# Patient Record
Sex: Female | Born: 1970 | Race: White | Hispanic: No | Marital: Married | Smoking: Former smoker
Health system: Southern US, Community
[De-identification: ages and names within clinical notes are randomized; demographics above are authoritative.]

## PROBLEM LIST (undated history)

## (undated) DIAGNOSIS — T7840XA Allergy, unspecified, initial encounter: Secondary | ICD-10-CM

## (undated) DIAGNOSIS — G709 Myoneural disorder, unspecified: Secondary | ICD-10-CM

## (undated) DIAGNOSIS — E119 Type 2 diabetes mellitus without complications: Secondary | ICD-10-CM

## (undated) HISTORY — DX: Allergy, unspecified, initial encounter: T78.40XA

## (undated) HISTORY — DX: Myoneural disorder, unspecified: G70.9

## (undated) HISTORY — DX: Type 2 diabetes mellitus without complications: E11.9

## (undated) HISTORY — PX: PILONIDAL CYST / SINUS EXCISION: SUR543

## (undated) HISTORY — PX: CHOLECYSTECTOMY: SHX55

---

## 2004-06-26 ENCOUNTER — Emergency Department (HOSPITAL_COMMUNITY): Admission: EM | Admit: 2004-06-26 | Discharge: 2004-06-26 | Payer: Self-pay | Admitting: Emergency Medicine

## 2010-06-29 ENCOUNTER — Emergency Department (HOSPITAL_COMMUNITY)
Admission: EM | Admit: 2010-06-29 | Discharge: 2010-06-29 | Payer: Self-pay | Source: Home / Self Care | Admitting: Emergency Medicine

## 2011-08-27 ENCOUNTER — Ambulatory Visit (INDEPENDENT_AMBULATORY_CARE_PROVIDER_SITE_OTHER): Payer: 59

## 2011-08-27 DIAGNOSIS — E559 Vitamin D deficiency, unspecified: Secondary | ICD-10-CM

## 2011-08-27 DIAGNOSIS — I10 Essential (primary) hypertension: Secondary | ICD-10-CM

## 2011-08-27 DIAGNOSIS — R609 Edema, unspecified: Secondary | ICD-10-CM

## 2011-08-27 DIAGNOSIS — E789 Disorder of lipoprotein metabolism, unspecified: Secondary | ICD-10-CM

## 2011-08-27 DIAGNOSIS — M79609 Pain in unspecified limb: Secondary | ICD-10-CM

## 2011-08-27 DIAGNOSIS — R52 Pain, unspecified: Secondary | ICD-10-CM

## 2011-11-26 ENCOUNTER — Other Ambulatory Visit: Payer: Self-pay | Admitting: Physician Assistant

## 2011-11-26 MED ORDER — GABAPENTIN 300 MG PO CAPS: 300.0000 mg | ORAL_CAPSULE | Freq: Two times a day (BID) | ORAL | Status: DC

## 2012-03-01 ENCOUNTER — Other Ambulatory Visit: Payer: Self-pay | Admitting: Physician Assistant

## 2012-03-06 ENCOUNTER — Ambulatory Visit (INDEPENDENT_AMBULATORY_CARE_PROVIDER_SITE_OTHER): Payer: 59 | Admitting: Physician Assistant

## 2012-03-06 VITALS — BP 162/92 | HR 86 | Temp 98.4°F | Resp 16 | Ht 63.0 in | Wt 262.0 lb

## 2012-03-06 DIAGNOSIS — R635 Abnormal weight gain: Secondary | ICD-10-CM

## 2012-03-06 DIAGNOSIS — R5381 Other malaise: Secondary | ICD-10-CM

## 2012-03-06 DIAGNOSIS — R5383 Other fatigue: Secondary | ICD-10-CM

## 2012-03-06 LAB — T4, FREE: Free T4: 0.92 ng/dL (ref 0.80–1.80)

## 2012-03-06 NOTE — Progress Notes (Signed)
  Subjective:    Patient ID: Jeanne Choi, female    DOB: [birth year], 41 y.o.   MRN: [id number]  HPI Ms. Dierks comes in today to discuss lab results from her GYN done in 3/13.  She had a TSH/Free T4 done at her last ov with Dr. Waynard Reeds and it was noted that her TSH was 5.497 but free T4 was normal.  Also of note her A1C was 6.1%.  She has not followed up on these lab results until now.  She reports that she has had fatigue, hair loss and weight gain.  Her weight has been difficult for her to manage in general, especially the last 5 years.  She does not exercise because of her job and various pains she has at times.  She admits to poor eating especially carbs.  She is aware of the need to change her lifestyle but gets very discouraged. She had normal TSH and T4 in December 2012 here.  She has a potential PCOS diagnosis from GYN but not confirmed yet, per pt.  She will be following up for CPE with Benny Lennert, PA-C when she returns from maternity leave in several weeks but wanted to address this now.   Review of Systems As noted in HPI, otherwise negative     Objective:   Physical Exam  Constitutional: She is oriented to person, place, and time. She appears well-developed and well-nourished.  HENT:  Mouth/Throat: Oropharynx is clear and moist.  Neck: No mass and no thyromegaly present.  Cardiovascular: Normal rate and regular rhythm.   Pulmonary/Chest: Effort normal and breath sounds normal.  Lymphadenopathy:    She has no cervical adenopathy.  Neurological: She is alert and oriented to person, place, and time.  Skin: Skin is warm.        Assessment & Plan:  Fatigue Hair loss Abnormal Labs  Check TSH, Free T4 Long discussion about weight loss, carb intake, exercise options.   Follow up after labs

## 2012-03-08 ENCOUNTER — Telehealth: Payer: Self-pay

## 2012-03-08 NOTE — Telephone Encounter (Signed)
Explained lab results to pt. She would like a referral to an endocrinologist but doesn't remember his name. She will call back in and give [place] his name.

## 2012-03-23 ENCOUNTER — Ambulatory Visit (INDEPENDENT_AMBULATORY_CARE_PROVIDER_SITE_OTHER): Payer: 59 | Admitting: Internal Medicine

## 2012-03-23 ENCOUNTER — Encounter: Payer: Self-pay | Admitting: Physician Assistant

## 2012-03-23 VITALS — BP 152/87 | HR 79 | Temp 97.7°F | Resp 20 | Ht 63.0 in | Wt 258.0 lb

## 2012-03-23 DIAGNOSIS — M533 Sacrococcygeal disorders, not elsewhere classified: Secondary | ICD-10-CM

## 2012-03-23 DIAGNOSIS — N926 Irregular menstruation, unspecified: Secondary | ICD-10-CM

## 2012-03-23 DIAGNOSIS — R030 Elevated blood-pressure reading, without diagnosis of hypertension: Secondary | ICD-10-CM

## 2012-03-23 DIAGNOSIS — Z0000 Encounter for general adult medical examination without abnormal findings: Secondary | ICD-10-CM

## 2012-03-23 DIAGNOSIS — IMO0001 Reserved for inherently not codable concepts without codable children: Secondary | ICD-10-CM

## 2012-03-23 DIAGNOSIS — E663 Overweight: Secondary | ICD-10-CM

## 2012-03-23 DIAGNOSIS — E282 Polycystic ovarian syndrome: Secondary | ICD-10-CM

## 2012-03-23 LAB — LIPID PANEL
LDL Cholesterol: 135 mg/dL — ABNORMAL HIGH (ref 0–99)
Total CHOL/HDL Ratio: 4.3 Ratio
Triglycerides: 115 mg/dL (ref <150)
VLDL: 23 mg/dL (ref 0–40)

## 2012-03-23 LAB — COMPREHENSIVE METABOLIC PANEL
ALT: 20 U/L (ref 0–35)
AST: 25 U/L (ref 0–37)
Alkaline Phosphatase: 83 U/L (ref 39–117)
Calcium: 9.1 mg/dL (ref 8.4–10.5)
Chloride: 106 mEq/L (ref 96–112)
Creat: 0.63 mg/dL (ref 0.50–1.10)
Total Bilirubin: 0.5 mg/dL (ref 0.3–1.2)

## 2012-03-23 LAB — POCT URINALYSIS DIPSTICK
Glucose, UA: NEGATIVE
Spec Grav, UA: >=1.03
Urobilinogen, UA: 0.2

## 2012-03-23 LAB — POCT GLYCOSYLATED HEMOGLOBIN (HGB A1C): Hemoglobin A1C: 5.7

## 2012-03-23 MED ORDER — HYDROCODONE-ACETAMINOPHEN 5-325 MG PO TABS: 1.0000 | ORAL_TABLET | ORAL | Status: DC | PRN

## 2012-03-23 MED ORDER — GABAPENTIN 300 MG PO CAPS: 300.0000 mg | ORAL_CAPSULE | Freq: Two times a day (BID) | ORAL | Status: DC

## 2012-03-23 MED ORDER — METFORMIN HCL 500 MG PO TABS: 500.0000 mg | ORAL_TABLET | Freq: Once | ORAL | Status: DC

## 2012-03-23 MED ORDER — SPIRONOLACTONE 25 MG PO TABS: 25.0000 mg | ORAL_TABLET | Freq: Every day | ORAL | Status: DC

## 2012-03-23 NOTE — Progress Notes (Signed)
Subjective:    Patient ID: Jeanne Choi, female    DOB: 1971-04-20, 41 y.o.   MRN: [id number]  HPI  Pt here for her annual exam.  She also has multiple concerns. 1- Would like a referral to another GYN in [place] that is not so busy.  She is suffering from irregular and heavy menses.  She has increased coccyx pain during her menses which causes problems with her pain.  She has had PCOS labs drawn and an [place] but the results have been inconclusive.  She now has a full facial beard that she has to shave daily. 2-coccyxdynia - improved significantly on neurontin - only really needs norco (she needs a refill) during her menses 3- bumps on her legs -  4- weight gain is still an issue - she has been trying to watch her calorie intake - she is using a phone app for monitoring 5- still having hair loss and fatigue but thyroid tests show normal function  Her mammogram and Pap were done in 4/13 by Dr. Tenny Craw.  See scanned health history sheet   Review of Systems See scanned health history sheet.    Objective:   Physical Exam  Constitutional: She is oriented to person, place, and time. She appears well-developed and well-nourished.  HENT:  Head: Normocephalic and atraumatic.  Right Ear: External ear normal.  Left Ear: External ear normal.  Mouth/Throat: Oropharynx is clear and moist.  Eyes: Conjunctivae and EOM are normal. Pupils are equal, round, and reactive to light.  Neck: Normal range of motion. Neck supple. No tracheal deviation present. No thyromegaly present.  Cardiovascular: Normal rate and regular rhythm.  Exam reveals no gallop.   No murmur heard. Pulmonary/Chest: Effort normal and breath sounds normal.  Abdominal: Soft. Bowel sounds are normal.  Lymphadenopathy:    She has no cervical adenopathy.  Neurological: She is alert and oriented to person, place, and time.  Skin: Skin is warm and dry. Rash (bumps that pt was referring to are small fibromas) noted.       Pt has facial  hair.  Psychiatric: She has a normal mood and affect. Her behavior is normal. Judgment and thought content normal.    Results for orders placed in visit on 03/23/12  POCT GLYCOSYLATED HEMOGLOBIN (HGB A1C)      Component Value Range   Hemoglobin A1C 5.7    GLUCOSE, POCT (MANUAL RESULT ENTRY)      Component Value Range   POC Glucose 93  70 - 99 mg/dl  POCT URINALYSIS DIPSTICK      Component Value Range   Color, UA yellow     Clarity, UA hazy     Glucose, UA neg     Bilirubin, UA small     Ketones, UA trace     Spec Grav, UA >=1.030     Blood, UA trace     pH, UA 5.5     Protein, UA 30     Urobilinogen, UA 0.2     Nitrite, UA neg     Leukocytes, UA Negative      NL EKG reading by Dr. Merla Riches.     Assessment & Plan:   1. Annual physical exam  POCT glycosylated hemoglobin (Hb A1C), POCT glucose (manual entry), POCT urinalysis dipstick, Lipid panel, Comprehensive metabolic panel, EKG 12-Lead  2. Elevated BP  EKG 12-Lead, spironolactone (ALDACTONE) 25 MG tablet  3. Overweight    4. Irregular menses    5. Coccyx pain  6. PCOS (polycystic ovarian syndrome)  metFORMIN (GLUCOPHAGE) 500 MG tablet, spironolactone (ALDACTONE) 25 MG tablet   Will plan on referral to another GYN for further eval/treatment of PCOS - will start Metformin 500mg  and Spironolactone (for PCOS and to help control BP) -  Plan to recheck pt in 2 months to monitor BP. Refilled pt medications.

## 2012-03-24 ENCOUNTER — Other Ambulatory Visit: Payer: Self-pay | Admitting: Internal Medicine

## 2012-04-16 ENCOUNTER — Other Ambulatory Visit: Payer: Self-pay | Admitting: Physician Assistant

## 2012-05-21 ENCOUNTER — Other Ambulatory Visit: Payer: Self-pay | Admitting: Physician Assistant

## 2012-05-24 ENCOUNTER — Other Ambulatory Visit: Payer: Self-pay | Admitting: Family Medicine

## 2012-05-24 MED ORDER — VITAMIN D (ERGOCALCIFEROL) 1.25 MG (50000 UNIT) PO CAPS: 50000.0000 [IU] | ORAL_CAPSULE | ORAL | Status: DC

## 2012-06-01 ENCOUNTER — Ambulatory Visit: Payer: 59 | Admitting: Physician Assistant

## 2012-07-01 LAB — LIPID PANEL
HDL: 57 mg/dL (ref 35–70)
LDL Cholesterol: 131 mg/dL
Triglycerides: 105 mg/dL (ref 40–160)

## 2012-07-01 LAB — HEMOGLOBIN A1C: Hgb A1c MFr Bld: 6 % (ref 4.0–6.0)

## 2012-07-01 LAB — DHEA-SULFATE: DHEA-SO4: 29

## 2012-07-01 LAB — TSH: TSH: 3.62 u[IU]/mL (ref 0.41–5.90)

## 2012-07-13 ENCOUNTER — Encounter: Payer: Self-pay | Admitting: Physician Assistant

## 2012-07-13 ENCOUNTER — Ambulatory Visit (INDEPENDENT_AMBULATORY_CARE_PROVIDER_SITE_OTHER): Payer: 59 | Admitting: Physician Assistant

## 2012-07-13 VITALS — BP 118/78 | HR 80 | Temp 98.4°F | Resp 20 | Ht 63.0 in | Wt 261.8 lb

## 2012-07-13 DIAGNOSIS — M533 Sacrococcygeal disorders, not elsewhere classified: Secondary | ICD-10-CM | POA: Insufficient documentation

## 2012-07-13 DIAGNOSIS — E8881 Metabolic syndrome: Secondary | ICD-10-CM

## 2012-07-13 DIAGNOSIS — E798 Other disorders of purine and pyrimidine metabolism: Secondary | ICD-10-CM

## 2012-07-13 DIAGNOSIS — M7750 Other enthesopathy of unspecified foot: Secondary | ICD-10-CM

## 2012-07-13 DIAGNOSIS — E282 Polycystic ovarian syndrome: Secondary | ICD-10-CM | POA: Insufficient documentation

## 2012-07-13 MED ORDER — GABAPENTIN 100 MG PO CAPS: 100.0000 mg | ORAL_CAPSULE | Freq: Three times a day (TID) | ORAL | Status: DC

## 2012-07-13 MED ORDER — METFORMIN HCL ER 500 MG PO TB24: 1000.0000 mg | ORAL_TABLET | Freq: Every day | ORAL | Status: DC

## 2012-07-13 MED ORDER — OXYCODONE-ACETAMINOPHEN 5-325 MG PO TABS: 1.0000 | ORAL_TABLET | Freq: Three times a day (TID) | ORAL | Status: DC | PRN

## 2012-07-13 NOTE — Progress Notes (Signed)
[address] [place] [address]   Phone [phone]  Subjective:    Patient ID: Jeanne Choi, female    DOB: 1970-12-29, 41 y.o.   MRN: [id number]  HPI Pt presents to clinic for f/u of her multiple problems 1- PCOS - seeing Dr. Chevis Pretty and is really happy with her treatment - plan is to do provera again to stop her bleeding and then place a mirena - he redid her labs and were the same as when I did them - the spironolactone did not help with hirsutism and her BP seems good today - she does want to restart the glucophage in hopes that it will help her metabolic syndrome that is associated with her PCOS 2- she is really frustrated with the fact that she has all the symptoms of hypothyroidism (dry hair, trouble losing weight, loss of hair, dry skin) but her labs are normal, her T4 was normal in 6/13. 3- pain in her coccyx seems to be getting worse - the burning is terrible - it is definitely worse with her bleeding - she has had to stop the vicodin because he makes her mad and irritable - she had some left over percocet from a dental procedure and that helped the pain but did not affect her mood - she is hoping that when her mirena is placed and her bleeding stops her pain will be better - it seems that the neurontin is not helping like it used to 4- obesity - she is really frustrated - tries to eat healthy but does not exercise and is not a physical person.  She likes to sit on the couch and eat ice cream. 5- still has pain in her foot where she had to have stitches - it is also a burning pain but the neurontin helps.  Review of Systems  Genitourinary: Positive for vaginal bleeding.  Musculoskeletal: Positive for back pain.       Objective:   Physical Exam  Vitals reviewed. Constitutional: She is oriented to person, place, and time. She appears well-developed and well-nourished.  HENT:  Head: Normocephalic and atraumatic.  Right Ear: External ear normal.  Left Ear: External ear  normal.  Eyes: Conjunctivae normal and EOM are normal. Pupils are equal, round, and reactive to light.  Cardiovascular: Normal rate, regular rhythm and normal heart sounds.   No murmur heard. Pulmonary/Chest: Effort normal and breath sounds normal.  Abdominal: Soft. Bowel sounds are normal.  Neurological: She is alert and oriented to person, place, and time.  Skin: Skin is warm and dry.  Psychiatric: She has a normal mood and affect. Her behavior is normal. Judgment and thought content normal.       Assessment & Plan:   1. Coccyxdynia  gabapentin (NEURONTIN) 100 MG capsule, oxyCODONE-acetaminophen (ROXICET) 5-325 MG per tablet  2. PCOS (polycystic ovarian syndrome)  metFORMIN (GLUCOPHAGE XR) 500 MG 24 hr tablet  3. Metabolic syndrome  metFORMIN (GLUCOPHAGE XR) 500 MG 24 hr tablet   1- will increase her neurontin - pt can increase her dose to up to Neurontin 600mg  tid - she will titrate using 100mg  pills and call me in a month so we can determine her next Rx. She will titrate to pain relief.  Will write her some percocet for each month esp when her menstrual bleeding is the worst to help with breakthrough pain. 2- will restart metformin at a higher dose and she will continue with her visits and plan with Dr. Chevis Pretty. 3- pt will  continue with small healthy lifestyle changes.  Even though she feels like she has hypothyroidism her labs are euthyroid.  I offered an endo referral but pt states she will continue to try lifestyle changes and we discussed them again.

## 2012-08-10 ENCOUNTER — Other Ambulatory Visit: Payer: Self-pay | Admitting: Physician Assistant

## 2012-08-30 ENCOUNTER — Other Ambulatory Visit: Payer: Self-pay | Admitting: Physician Assistant

## 2012-09-08 ENCOUNTER — Other Ambulatory Visit: Payer: Self-pay | Admitting: Physician Assistant

## 2012-09-09 ENCOUNTER — Telehealth: Payer: Self-pay

## 2012-09-09 NOTE — Telephone Encounter (Signed)
Patient has been denied twice for gabapentin (NEURONTIN) 100 MG capsule [[id number]]  Because pharmacy says there is a discrepancy with the dosage, but she says she has seen Maralyn Sago weber recently and they decided on 900mg  per day. She is wondering why she is being denied.

## 2012-09-11 MED ORDER — GABAPENTIN 300 MG PO CAPS: 900.0000 mg | ORAL_CAPSULE | Freq: Every day | ORAL | Status: DC

## 2012-09-11 NOTE — Telephone Encounter (Signed)
Called left message for her to advise.

## 2012-09-11 NOTE — Telephone Encounter (Signed)
rx sent to pharmacy - sorry for the delay we just needed to clarify dosage.

## 2012-10-08 IMAGING — CR DG FOOT COMPLETE 3+V*L*
3 series · 3 of 3 positions shown · non-contrast
Comparison: None.

CLINICAL DATA: Laceration to the plantar surface of the base of the
left big toe and second toe.

LEFT FOOT - COMPLETE 3+ VIEW

[view not recorded (1 of 3)]
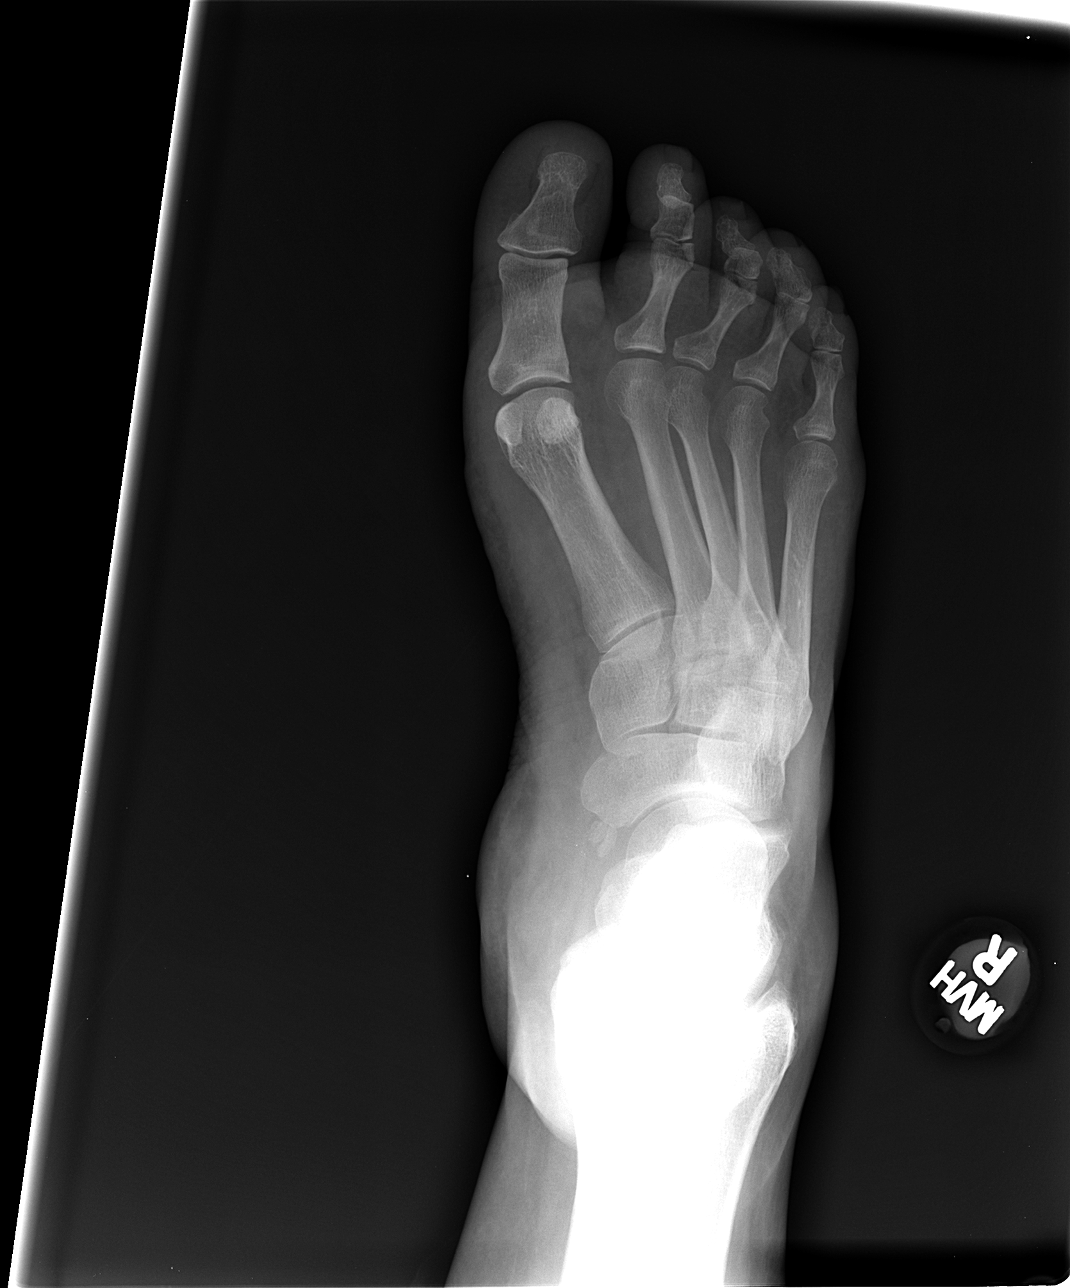

[view not recorded (2 of 3)]
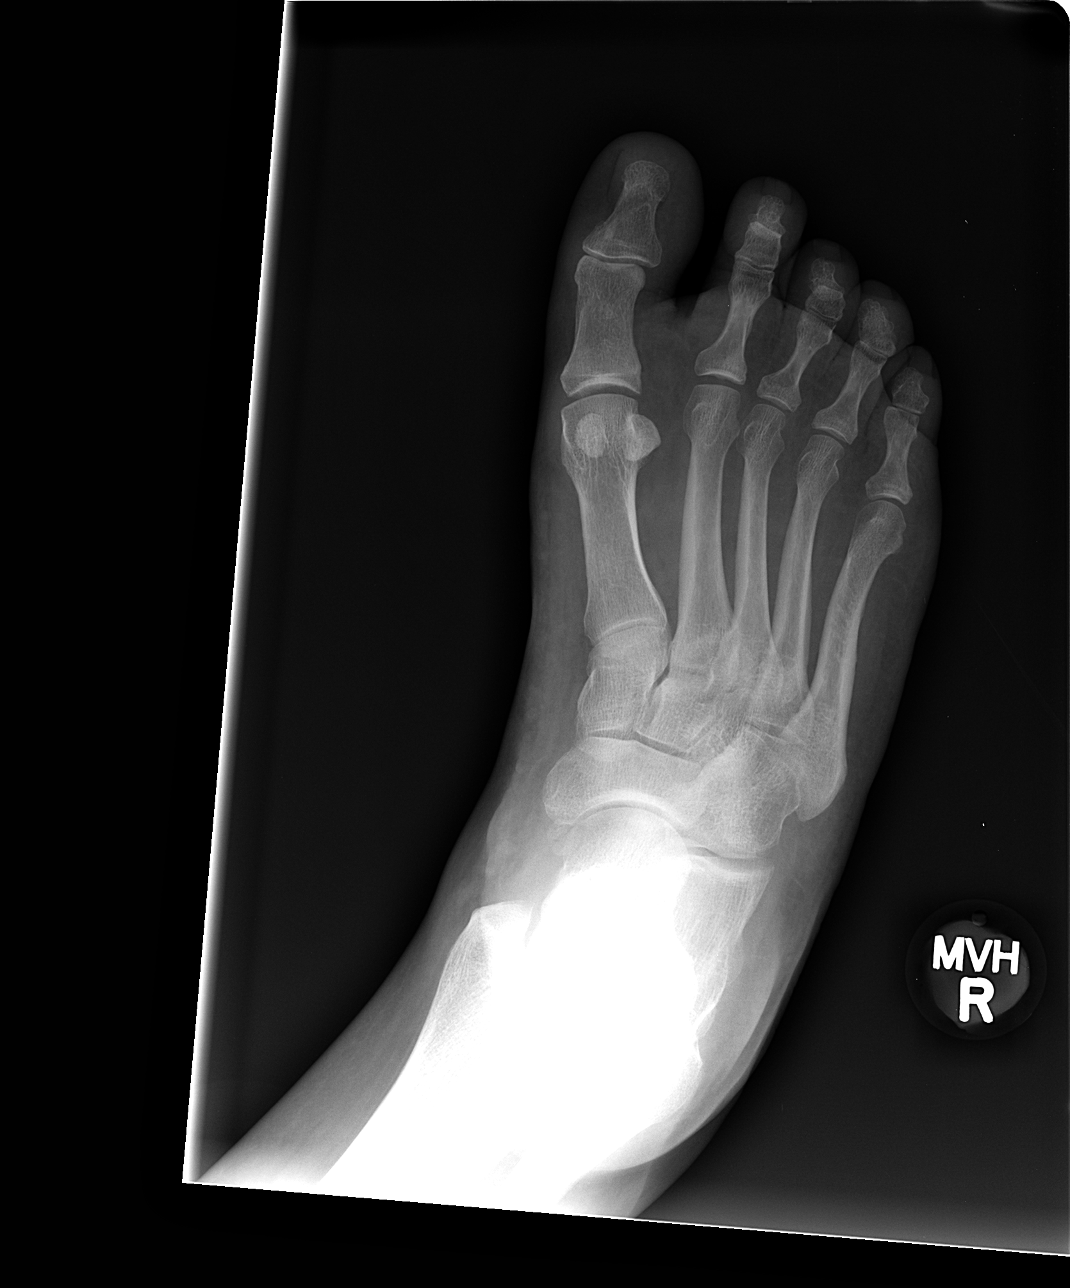

[view not recorded (3 of 3)]
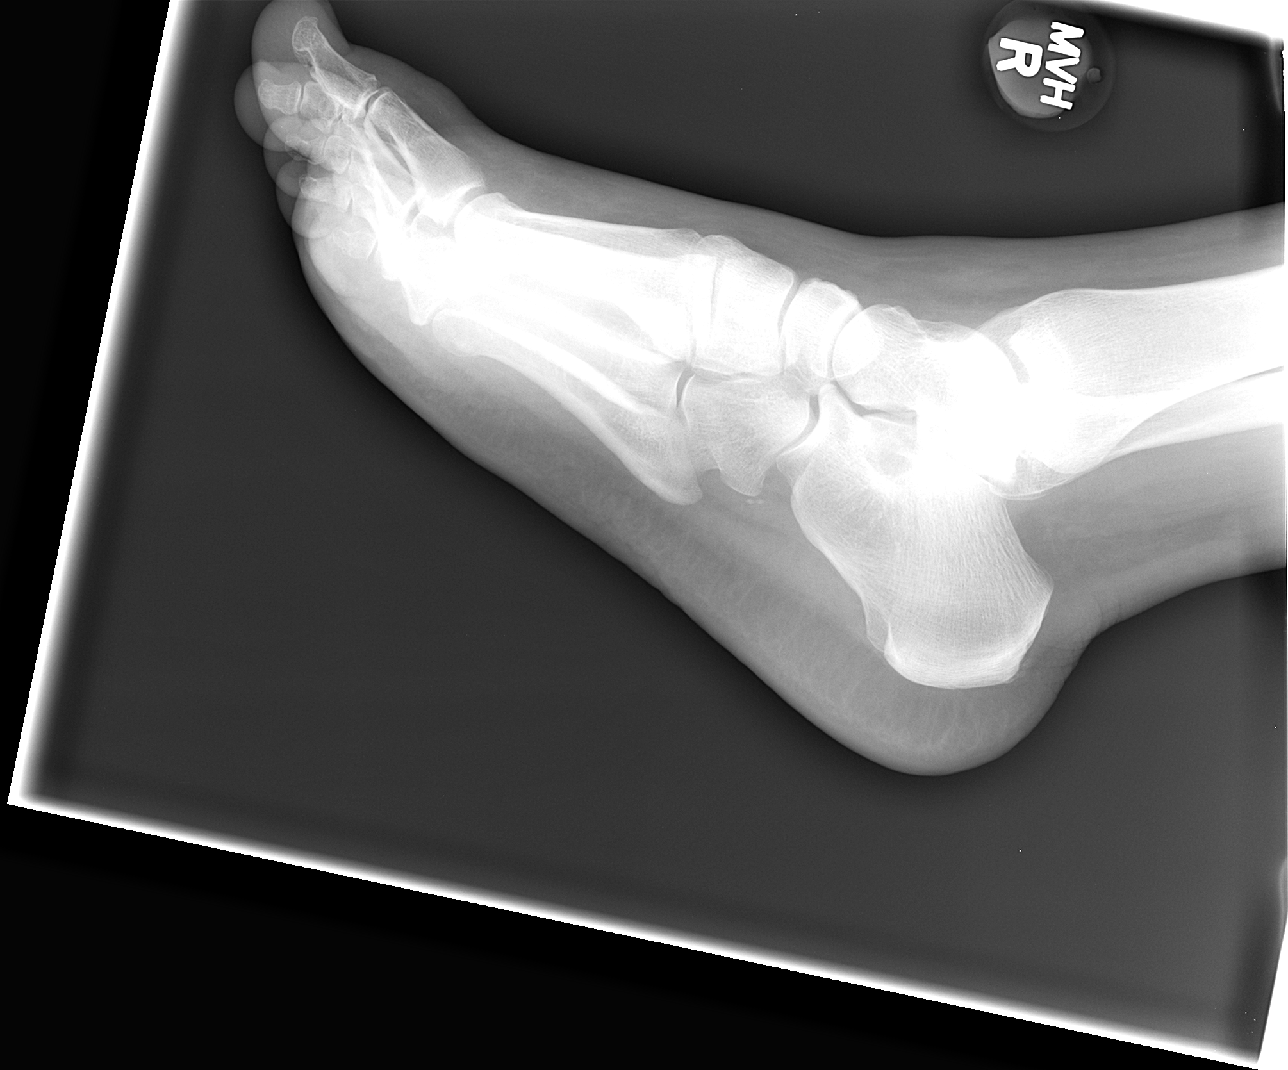

[3 of 3 positions shown; findings below may reference images not displayed]

FINDINGS: There is no evidence of fracture or dislocation.  The
joint spaces are preserved.  There is no evidence of talar
subluxation; the subtalar joint is unremarkable in appearance.  An
os naviculare and a small os peroneum is noted.

The known soft tissue laceration is not well characterized on
radiograph.
IMPRESSION: 1.  No evidence of fracture or dislocation.
2.  Os naviculare and small os peroneum noted.

## 2012-10-18 ENCOUNTER — Other Ambulatory Visit: Payer: Self-pay | Admitting: *Deleted

## 2012-10-18 MED ORDER — METFORMIN HCL ER 500 MG PO TB24: 1000.0000 mg | ORAL_TABLET | Freq: Every day | ORAL | Status: DC

## 2012-11-16 ENCOUNTER — Other Ambulatory Visit: Payer: Self-pay | Admitting: Physician Assistant

## 2012-12-04 ENCOUNTER — Ambulatory Visit (INDEPENDENT_AMBULATORY_CARE_PROVIDER_SITE_OTHER): Payer: 59 | Admitting: Internal Medicine

## 2012-12-04 VITALS — BP 124/86 | HR 100 | Temp 98.1°F | Resp 16 | Ht 63.0 in | Wt 268.4 lb

## 2012-12-04 DIAGNOSIS — M533 Sacrococcygeal disorders, not elsewhere classified: Secondary | ICD-10-CM

## 2012-12-04 DIAGNOSIS — E282 Polycystic ovarian syndrome: Secondary | ICD-10-CM

## 2012-12-04 DIAGNOSIS — G894 Chronic pain syndrome: Secondary | ICD-10-CM

## 2012-12-04 MED ORDER — GABAPENTIN 300 MG PO CAPS: 900.0000 mg | ORAL_CAPSULE | Freq: Every day | ORAL | Status: DC

## 2012-12-04 MED ORDER — OXYCODONE-ACETAMINOPHEN 5-325 MG PO TABS: 1.0000 | ORAL_TABLET | Freq: Three times a day (TID) | ORAL | Status: DC | PRN

## 2012-12-04 MED ORDER — METFORMIN HCL ER 500 MG PO TB24: 500.0000 mg | ORAL_TABLET | Freq: Every day | ORAL | Status: DC

## 2012-12-04 NOTE — Progress Notes (Signed)
  Subjective:    Patient ID: Jeanne Choi, female    DOB: [birth year], 42 y.o.   MRN: [id number]  HPI Patient of Ms. Webers. Needs rfs of meds. Has chronic pain and pcos. Agrees to see [name] in 1-2 months for lab work and regular f/up   Review of Systems    No new issues Objective:   Physical Exam  Vitals reviewed. Constitutional: She is oriented to person, place, and time. She appears well-nourished. No distress.  Neck: Neck supple.  Cardiovascular: Normal rate, regular rhythm and normal heart sounds.   Pulmonary/Chest: Effort normal.  Neurological: She is alert and oriented to person, place, and time. She exhibits normal muscle tone. Coordination normal.  Psychiatric: She has a normal mood and affect.          Assessment & Plan:  RF gabapentin/metformin/oxycodone Must see [name] 1-2 months

## 2012-12-04 NOTE — Patient Instructions (Signed)

## 2012-12-06 ENCOUNTER — Other Ambulatory Visit: Payer: Self-pay | Admitting: Physician Assistant

## 2012-12-06 NOTE — Telephone Encounter (Signed)
PT USUALLY SEES SARAH WEBER, BUT SHE SAW GUEST THIS LAST TIME.  SAID THAT THE LAST TIME SHE SAW SARAH, SHE HAD INCREASED HER METFORMIN TO TWO A DAY.  WHEN GUEST REFILLED IT, HE ONLY FILLED IT FOR ONE PER DAY.  PLEASE ADVISE.  [phone]

## 2013-02-03 ENCOUNTER — Other Ambulatory Visit: Payer: Self-pay | Admitting: Internal Medicine

## 2013-04-01 ENCOUNTER — Ambulatory Visit (INDEPENDENT_AMBULATORY_CARE_PROVIDER_SITE_OTHER): Payer: 59 | Admitting: Physician Assistant

## 2013-04-01 VITALS — BP 140/100 | HR 102 | Temp 97.8°F | Resp 16 | Ht 63.0 in | Wt 268.0 lb

## 2013-04-01 DIAGNOSIS — E669 Obesity, unspecified: Secondary | ICD-10-CM | POA: Insufficient documentation

## 2013-04-01 DIAGNOSIS — F430 Acute stress reaction: Secondary | ICD-10-CM

## 2013-04-01 DIAGNOSIS — M533 Sacrococcygeal disorders, not elsewhere classified: Secondary | ICD-10-CM

## 2013-04-01 DIAGNOSIS — L719 Rosacea, unspecified: Secondary | ICD-10-CM

## 2013-04-01 DIAGNOSIS — IMO0001 Reserved for inherently not codable concepts without codable children: Secondary | ICD-10-CM

## 2013-04-01 DIAGNOSIS — G894 Chronic pain syndrome: Secondary | ICD-10-CM

## 2013-04-01 DIAGNOSIS — R030 Elevated blood-pressure reading, without diagnosis of hypertension: Secondary | ICD-10-CM

## 2013-04-01 DIAGNOSIS — E282 Polycystic ovarian syndrome: Secondary | ICD-10-CM

## 2013-04-01 DIAGNOSIS — M25579 Pain in unspecified ankle and joints of unspecified foot: Secondary | ICD-10-CM

## 2013-04-01 LAB — GLUCOSE, POCT (MANUAL RESULT ENTRY): POC Glucose: 130 mg/dl — AB (ref 70–99)

## 2013-04-01 MED ORDER — METRONIDAZOLE 1 % EX GEL: Freq: Every day | CUTANEOUS | Status: DC

## 2013-04-01 MED ORDER — LORAZEPAM 1 MG PO TABS: 1.0000 mg | ORAL_TABLET | Freq: Two times a day (BID) | ORAL | Status: DC | PRN

## 2013-04-01 MED ORDER — OXYCODONE-ACETAMINOPHEN 5-325 MG PO TABS: 1.0000 | ORAL_TABLET | Freq: Three times a day (TID) | ORAL | Status: DC | PRN

## 2013-04-01 MED ORDER — SPIRONOLACTONE 25 MG PO TABS: 25.0000 mg | ORAL_TABLET | Freq: Every day | ORAL | Status: DC

## 2013-04-01 NOTE — Progress Notes (Signed)
   [address], [place] [address]   Phone [phone]  Subjective:    Patient ID: Jeanne Choi, female    DOB: 19-Oct-1970, 42 y.o.   MRN: [id number]  HPI Pt presents to clinic for multiple reasons 1- wound like a referral to endo -- she really feels like she has hypothyroidism even though her labs have been normal due to hard time losing weight, skin changes and hair thinning.  She has PCOS in addition and knows she eats poorly. 2- having more pain in her foot and would like a referral to podiatry 3- concerned that Metformin is giving her SE of nausea and diarrhea at the increased dose of BID - when she feels bad she only wants to eat eggs and potatoes that does not help with her weight 4- has been noticing swelling in her fingers - but has been eating a lot of junk foods that contain a lot of salt - she drinks about 3 diet sodas a day -- her diet has slightly improved but it still is bad and she gets no physical exercise 5- needs a refill on her gabapentin 6- still having spotting about >6 months on the mirena but she is still using e-cigs and not willing to stop her nicotine 7- she has had increased anxiety recently and used the rest of her Ativan and would like a refill of that 8- needs a refill of her Percocer using about 3x/wk for his back pain and her increasing foot pain 9- her Rosacea is getting worse and now feels like it looks terrible - wondering if that is related to her hair loss - she has stopped dying her hair because she felt like that might be harming it  Review of Systems     Objective:   Physical Exam  Vitals reviewed. Constitutional: She is oriented to person, place, and time. She appears well-developed and well-nourished.  HENT:  Head: Normocephalic and atraumatic.  Right Ear: External ear normal.  Left Ear: External ear normal.  Eyes: Conjunctivae are normal.  Pulmonary/Chest: Effort normal.  Neurological: She is alert and oriented to person, place, and  time.  Skin: Skin is warm and dry.  Hair is think but no bald spots  Psychiatric: She has a normal mood and affect. Her behavior is normal. Thought content normal.       Assessment & Plan:  Coccyxdynia - Plan: oxyCODONE-acetaminophen (ROXICET) 5-325 MG per tablet  Chronic pain syndrome - continue pain meds as needed- Plan: oxyCODONE-acetaminophen (ROXICET) 5-325 MG per tablet  PCO (polycystic ovaries) - will decrease her metformin to qd to see if her symptoms of nausea and diarrhea improve- Plan: Ambulatory referral to Endocrinology, POCT glucose (manual entry)  Elevated BP- due to her PCOS and slight increase in her bp will restart this medication - Plan: spironolactone (ALDACTONE) 25 MG tablet  Morbid obesity - i really think patients weight is from her lack of exercise and diet but I think a referral to endo is needed for the patients comfort with the situation.  Stress reaction - refill her meds do not expect this to be a long term medication -  Plan: LORazepam (ATIVAN) 1 MG tablet  Pain in joint, ankle and foot, unspecified laterality - Plan: Ambulatory referral to Podiatry  Rosacea - will try this to see if it makes a difference- Plan: metroNIDAZOLE (METROGEL) 1 % gel  Benny Lennert PA-C 04/01/2013 10:54 AM

## 2013-04-07 ENCOUNTER — Telehealth: Payer: Self-pay | Admitting: Radiology

## 2013-04-07 NOTE — Telephone Encounter (Signed)
Patient indicates she has had a call from you, I see nothing on file about this, please advise.

## 2013-04-07 NOTE — Telephone Encounter (Signed)
LMOM w/detailed mes to CB with report of other meds she has tried/failed in the past for her rosecia. PA form is in [employer] box.

## 2013-04-08 ENCOUNTER — Other Ambulatory Visit: Payer: Self-pay | Admitting: Physician Assistant

## 2013-04-11 NOTE — Telephone Encounter (Signed)
LMOM to CB. 

## 2013-04-13 ENCOUNTER — Encounter: Payer: Self-pay | Admitting: Endocrinology

## 2013-04-13 ENCOUNTER — Telehealth: Payer: Self-pay | Admitting: *Deleted

## 2013-04-13 ENCOUNTER — Ambulatory Visit (INDEPENDENT_AMBULATORY_CARE_PROVIDER_SITE_OTHER): Payer: 59 | Admitting: Endocrinology

## 2013-04-13 VITALS — BP 138/82 | HR 84 | Temp 98.0°F | Resp 12 | Ht 62.0 in | Wt 270.9 lb

## 2013-04-13 DIAGNOSIS — E282 Polycystic ovarian syndrome: Secondary | ICD-10-CM

## 2013-04-13 DIAGNOSIS — R7309 Other abnormal glucose: Secondary | ICD-10-CM

## 2013-04-13 DIAGNOSIS — R5381 Other malaise: Secondary | ICD-10-CM

## 2013-04-13 DIAGNOSIS — R5383 Other fatigue: Secondary | ICD-10-CM

## 2013-04-13 DIAGNOSIS — R7302 Impaired glucose tolerance (oral): Secondary | ICD-10-CM

## 2013-04-13 NOTE — Telephone Encounter (Signed)
Pt said her PCP did not do a 2 hour GTT, she wanted to know which lab you wanted her to go to in order to get it done?

## 2013-04-13 NOTE — Telephone Encounter (Signed)
I have put the orders in Epic and her physician can see this

## 2013-04-13 NOTE — Progress Notes (Signed)
Patient ID: Jeanne Choi, female   DOB: 12/19/1970, 42 y.o.   MRN: [id number]  History of Present Illness:  The patient here for multiple problems:   WEIGHT gain: She has gained about 70 pounds in the last 5 years or so. She thinks she is trying to walk at work and sometimes otherwise also but is limited because of foot pain and pain related to her old pilonidal sinus surgery. She generally tries to watch her diet and is mostly eating vegetarian food. She has seen a dietitian but she thinks she is usually watching calories and portions as well as low-fat intake. She is interested in weight loss  Fatigue: She gets tired easily. Does not have chronic cold intolerance and is generally getting more hot but occasionally cold. Also she is concerned about her thyroid because of tendency to hair loss and dry skin. Her TSH has been consistently normal including in 06/2012 and free T4 was normal about a year ago  HAIR loss: She has had progressive hair loss over the last 1-1/2 years which has been more prominent in the front area and mild receding of the hairline on the right side. She thinks her hair loss is now all over and is continuing  FACIAL hair: This has been present for about 2 years especially on her chin and is progressive. She has been shaving daily to take care of this. Also has had some continuing acne. She was started on Aldactone about a year ago for a short time and again has been put on 25 mg daily this month by her PCP  MENSTRUAL irregularity: She has had infrequent menstrual cycles since menarche. She would have variable intervals between her cycles ranging from a couple of months to up to 3 years. She has had progesterone from her gynecologist couple of times which temporarily would help her cycles but more recently had had continuous spotting for about a year. She now has had an IUD with which her spotting has decreased in the last week or so. She was told to have PCO S. but has only a  total testosterone of 72.5 done last year and not clear what the normal range is for this test. She was started on metformin ER 500 mg a year ago. No subsequent testosterone levels are available. She was told in March to increase the dose to 2 but with this she has had loose stools, some incontinence and nausea and has reduced the dose to 1 tablet this month  ? Diabetes: She says her fasting glucose was 99 last year but has not had a fasting to close this year. Her last A1c was 6%   Past Medical History  Diagnosis Date  . Allergy   . Neuromuscular disorder   . Diabetes mellitus without complication     Past Surgical History  Procedure Laterality Date  . Cholecystectomy    . Pilonidal cyst / sinus excision      Family History  Problem Relation Age of Onset  . Diabetes Mother   . Hypertension Mother   . Heart disease Maternal Grandfather     Social History:  reports that she quit smoking about 3 years ago. She does not have any smokeless tobacco history on file. She reports that she drinks about 1.2 ounces of alcohol per week. She reports that she does not use illicit drugs.  Allergies:  Allergies  Allergen Reactions  . Iodine Rash      Medication List  This list is accurate as of: 04/13/13  8:25 AM.  Always use your most recent med list.               gabapentin 300 MG capsule  Commonly known as:  NEURONTIN  TAKE 3 CAPSULES BY MOUTH EVERY DAY     ibuprofen 100 MG tablet  Commonly known as:  ADVIL,MOTRIN  Take 100 mg by mouth every 6 (six) hours as needed for fever.     levonorgestrel 20 MCG/24HR IUD  Commonly known as:  MIRENA  1 each by Intrauterine route once.     LORazepam 1 MG tablet  Commonly known as:  ATIVAN  Take 1 tablet (1 mg total) by mouth 2 (two) times daily as needed for anxiety.     metFORMIN 500 MG 24 hr tablet  Commonly known as:  GLUCOPHAGE-XR     metroNIDAZOLE 1 % gel  Commonly known as:  METROGEL  Apply topically daily.      oxyCODONE-acetaminophen 5-325 MG per tablet  Commonly known as:  ROXICET  Take 1 tablet by mouth every 8 (eight) hours as needed for pain.     spironolactone 25 MG tablet  Commonly known as:  ALDACTONE  Take 1 tablet (25 mg total) by mouth daily.        Office Visit on 04/01/2013  Component Date Value Range Status  . POC Glucose 04/01/2013 130* 70 - 99 mg/dl Final  Office Visit on 07/13/2012  Component Date Value Range Status  . HM Mammogram 01/11/2010 nl   Final  . HM Pap smear 06/13/2012 nl - also nl endometrial bx   Final  . Triglycerides 07/01/2012 105  40 - 160 mg/dL Final  . HDL 16/06/9603 57  35 - 70 mg/dL Final  . LDL Cholesterol 07/01/2012 131   Final  . Hemoglobin A1C 07/01/2012 6.0  4.0 - 6.0 % Final  . TSH 07/01/2012 3.62  0.41 - 5.90 uIU/mL Final  . DHEA-SO4 07/01/2012 29   Final  . Testosterone 07/01/2012 72.50   Final     REVIEW OF SYSTEMS:           No unusual headaches.     Skin: No rash or infections     Thyroid:  She has had heat intolerance and occasionally gets cold, has had significant fatigue. TSH 3.6 last year     His blood pressure has been high but has been treated probably for the last 10 days     No swelling of feet.     No shortness of breath on exertion.     Bowel habits:  No change.                    Heartburn/pain: no.       Rectal bleeding/black stools: Not present.       No frequency of urination, nocturia, weak stream.       She has had joint  Pains mostly in her foot       She complains of pain in her perianal area/coccygeal area related to surgery on her pilonidal cyst and has difficulty with with mobility because of this      No  depression or anxiety. Some insomnia   EXAM:  BP 138/82  Pulse 84  Temp(Src) 98 F (36.7 C)  Resp 12  Ht 5\' 2"  (1.575 m)  Wt 270 lb 14.4 oz (122.879 kg)  BMI 49.54 kg/m2  SpO2 98%  LMP 12/31/2012  GENERAL: No cushingoid  features  No pallor, clubbing, lymphadenopathy or pedal edema.     Skin:  no rash. Has some ecchymosis on the right forearm. No thinning of the skin Has mild acne on face and upper chest Has times of facial hair on the chin Acanthosis of the back of the neck present with significant thickening  EYES:  Externally normal.  Fundii:  normal discs and vessels.  THYROID:  Not palpable.  HEART:  Normal apex, S1 and S2; no murmur or click.  CHEST:  Normal shape and expansion.  Lungs:  Percussion normal.  Vescicular breath sounds heard equally.  No crepitations/ wheeze.  ABDOMEN:  No distention.  Liver and spleen not palpable.  No other mass or tenderness.  NEUROLOGICAL: .Reflexes are normal bilaterally at biceps and ankles.  SPINE AND JOINTS:  Normal.  ASSESSMENT:    Probable PCOS with history of long-standing oligomenorrhea, needs to be confirmed with morning free testosterone which has not been done before   ? Diabetes, based on high-normal A1c and usual associated with PCOS; needs to be evaluated with glucose tolerance test which will help decide on treatment especially since she is intolerant to more than 500 mg of metformin ER. That he does have acanthosis on her exam indicating insulin resistance   Morbid obesity with difficulty losing weight, especially with the ability to exercise   Fatigue, rule out hypothyroidism including central   Hirsutism and hair loss likely to be related to PCOS. Agree with Spironolactone cough which she is taking only 25 mg now and she may need to be on 100 mg a day to be effective  She has hyperlipidemia  But only had high LDL on assessment last year with normal HDL and triglycerides, this should be followed at least annually    PLAN:   She does need nutritional counseling but she does not want to pay for this and has seen a dietitian already  She needs to exercise more but has difficulty with her musculoskeletal problems and fatigue  She may be a candidate for Victoza if she has diabetes, information  given  Labs to be done today include free T4, TSH, free and total testosterone, glucose tolerance test.  Jeanne Choi 04/13/2013, 8:25 AM

## 2013-04-13 NOTE — Telephone Encounter (Signed)
Left message on voicemail.

## 2013-04-13 NOTE — Telephone Encounter (Signed)
Pt CB and stated she has just tried OTC [place] for the rosecia in the past. I completed and faxed PA form to [facility].

## 2013-04-13 NOTE — Patient Instructions (Addendum)
To be scheduled for labs

## 2013-04-14 ENCOUNTER — Ambulatory Visit (INDEPENDENT_AMBULATORY_CARE_PROVIDER_SITE_OTHER): Payer: 59 | Admitting: Physician Assistant

## 2013-04-14 VITALS — BP 136/92 | HR 89 | Temp 98.0°F | Resp 17 | Ht 63.0 in | Wt 270.0 lb

## 2013-04-14 DIAGNOSIS — L03311 Cellulitis of abdominal wall: Secondary | ICD-10-CM

## 2013-04-14 DIAGNOSIS — L02219 Cutaneous abscess of trunk, unspecified: Secondary | ICD-10-CM

## 2013-04-14 DIAGNOSIS — R109 Unspecified abdominal pain: Secondary | ICD-10-CM

## 2013-04-14 MED ORDER — DOXYCYCLINE HYCLATE 100 MG PO TABS: 100.0000 mg | ORAL_TABLET | Freq: Two times a day (BID) | ORAL | Status: DC

## 2013-04-14 NOTE — Telephone Encounter (Signed)
So we can try metrogel vaginal which is the same medication but at a lower percentage as long as patient is ok with using a medication on her face that is meant for the vaginal but I have done this for other patients and it works pretty well.  If she is ok with this let me know and I will send the med to the pharmacy.

## 2013-04-14 NOTE — Progress Notes (Signed)
   [address] [place] [address]   Phone [phone]  Subjective:    Patient ID: Jeanne Choi, female    DOB: [birth year], 42 y.o.   MRN: [id number]  HPI Pt presents to clinic with abd wall abscess that has not started to drain yet.  She noticed it on Tuesday but it has continued to get worse.  It is really painful.  She used warm compresses once but has done nothing to it since.  She has had no fevers or chills and over all feels fine.   Review of Systems  Constitutional: Negative for fever and chills.  Skin: Positive for wound.       Objective:   Physical Exam  Vitals reviewed. Constitutional: She appears well-developed and well-nourished.  HENT:  Head: Normocephalic and atraumatic.  Right Ear: External ear normal.  Left Ear: External ear normal.  Neck: Normal range of motion.  Pulmonary/Chest: Effort normal.  Skin:  Draining abscess on R waist band area with bloody purulent drainage.  Local anesthesia and wound debrided and packed with 1/4 plain, drsg placed.  No surrounding erythema, some induration surrounding wound.       Assessment & Plan:  Cellulitis, abdominal wall - Plan: Wound culture, doxycycline (VIBRA-TABS) 100 MG tablet  Abdominal wall pain  Start abx.  Warm compresses.  Keep covered.  Recheck in 3 days.  Benny Lennert PA-C 04/14/2013 1:51 PM

## 2013-04-14 NOTE — Telephone Encounter (Signed)
Received denial for PA because it is excluded from the plan. Is there anything else you would like to rx for pt?

## 2013-04-15 NOTE — Telephone Encounter (Signed)
Patient returned call and states she will try this. I can send in but need directions. Please advise

## 2013-04-15 NOTE — Telephone Encounter (Signed)
Left message to return call 

## 2013-04-16 LAB — WOUND CULTURE
Gram Stain: NONE SEEN
Gram Stain: NONE SEEN
Organism ID, Bacteria: NO GROWTH

## 2013-04-17 ENCOUNTER — Ambulatory Visit (INDEPENDENT_AMBULATORY_CARE_PROVIDER_SITE_OTHER): Payer: 59 | Admitting: Physician Assistant

## 2013-04-17 VITALS — BP 144/98 | HR 92 | Temp 97.9°F | Resp 18 | Ht 63.0 in | Wt 266.4 lb

## 2013-04-17 DIAGNOSIS — L719 Rosacea, unspecified: Secondary | ICD-10-CM

## 2013-04-17 DIAGNOSIS — B373 Candidiasis of vulva and vagina: Secondary | ICD-10-CM

## 2013-04-17 DIAGNOSIS — K651 Peritoneal abscess: Secondary | ICD-10-CM

## 2013-04-17 DIAGNOSIS — IMO0002 Reserved for concepts with insufficient information to code with codable children: Secondary | ICD-10-CM

## 2013-04-17 DIAGNOSIS — M533 Sacrococcygeal disorders, not elsewhere classified: Secondary | ICD-10-CM

## 2013-04-17 MED ORDER — METRONIDAZOLE 0.75 % VA GEL: VAGINAL | Status: DC

## 2013-04-17 MED ORDER — FLUCONAZOLE 150 MG PO TABS: 150.0000 mg | ORAL_TABLET | Freq: Once | ORAL | Status: DC

## 2013-04-17 MED ORDER — OXYCODONE-ACETAMINOPHEN 5-325 MG PO TABS: 1.0000 | ORAL_TABLET | Freq: Three times a day (TID) | ORAL | Status: DC | PRN

## 2013-04-17 NOTE — Telephone Encounter (Signed)
I have sent the medication to the pharmacy .

## 2013-04-17 NOTE — Progress Notes (Signed)
   [address], [place] [address]   Phone [phone]  Subjective:    Patient ID: Jeanne Choi, female    DOB: 12-03-1970, 42 y.o.   MRN: [id number]  HPI  Pt presents to clinic for wound recheck.  She is still having pain and is having to use her pain medications bid.  There has been quite a bit of drainage from the wound. She is tolerating the abx ok but she thinks that she is starting to get a yeast infection. She would like treatment for her rosacea, she has not noticed that the Doxy has helped at all.  Review of Systems  Constitutional: Negative for fever and chills.  Gastrointestinal: Negative for nausea.  Skin: Positive for wound.       Objective:   Physical Exam  Vitals reviewed. Constitutional: She appears well-developed and well-nourished.  HENT:  Head: Normocephalic and atraumatic.  Right Ear: External ear normal.  Left Ear: External ear normal.  Pulmonary/Chest: Effort normal.  Abdominal: Soft.  Skin: Skin is warm and dry.  Drsg and packing removed.  No purulence expressed.  No surrounding erythema and only minimal induration.  Wound irrigated with 2% lido and repacked with 1/4 plain packing.  Drsg placed.  Psychiatric: She has a normal mood and affect. Her behavior is normal. Judgment and thought content normal.          Assessment & Plan:  Yeast vaginitis - Plan: fluconazole (DIFLUCAN) 150 MG tablet  Coccyxdynia - Med RF. Plan: oxyCODONE-acetaminophen (ROXICET) 5-325 MG per tablet  Rosacea - Plan:trial of Metrogel - we will use the vaginal preparation b/c her insurance will not cover the 1%. We will try topical preparation due to SE of oral medication that patient experiences.  Abscess, abdomen - healing well - I expect the packing to fall out and for her to not need a recheck, if she has any concerns she will let me know.  Benny Lennert PA-C 04/17/2013 11:02 AM

## 2013-05-06 ENCOUNTER — Other Ambulatory Visit: Payer: Self-pay | Admitting: Physician Assistant

## 2013-05-21 ENCOUNTER — Telehealth: Payer: Self-pay

## 2013-05-21 DIAGNOSIS — M533 Sacrococcygeal disorders, not elsewhere classified: Secondary | ICD-10-CM

## 2013-05-21 NOTE — Telephone Encounter (Signed)
Pt needs refill on oxycodone. Call at [phone].

## 2013-05-22 MED ORDER — OXYCODONE-ACETAMINOPHEN 5-325 MG PO TABS: 1.0000 | ORAL_TABLET | Freq: Three times a day (TID) | ORAL | Status: DC | PRN

## 2013-05-22 NOTE — Telephone Encounter (Signed)
Patient notified and voiced understanding.

## 2013-05-22 NOTE — Telephone Encounter (Signed)
Ready

## 2013-05-26 ENCOUNTER — Ambulatory Visit: Payer: 59 | Admitting: Endocrinology

## 2013-06-05 ENCOUNTER — Other Ambulatory Visit: Payer: Self-pay | Admitting: Physician Assistant

## 2013-06-24 ENCOUNTER — Ambulatory Visit (INDEPENDENT_AMBULATORY_CARE_PROVIDER_SITE_OTHER): Payer: 59 | Admitting: Physician Assistant

## 2013-06-24 VITALS — BP 132/80 | HR 107 | Temp 99.0°F | Resp 17 | Ht 63.0 in | Wt 275.0 lb

## 2013-06-24 DIAGNOSIS — E282 Polycystic ovarian syndrome: Secondary | ICD-10-CM

## 2013-06-24 DIAGNOSIS — R7309 Other abnormal glucose: Secondary | ICD-10-CM

## 2013-06-24 DIAGNOSIS — R739 Hyperglycemia, unspecified: Secondary | ICD-10-CM

## 2013-06-24 DIAGNOSIS — M533 Sacrococcygeal disorders, not elsewhere classified: Secondary | ICD-10-CM

## 2013-06-24 MED ORDER — METFORMIN HCL ER 500 MG PO TB24: 500.0000 mg | ORAL_TABLET | Freq: Two times a day (BID) | ORAL | Status: DC

## 2013-06-24 MED ORDER — SPIRONOLACTONE 25 MG PO TABS: 25.0000 mg | ORAL_TABLET | Freq: Every day | ORAL | Status: DC

## 2013-06-24 MED ORDER — OXYCODONE-ACETAMINOPHEN 5-325 MG PO TABS: 1.0000 | ORAL_TABLET | Freq: Three times a day (TID) | ORAL | Status: DC | PRN

## 2013-06-24 MED ORDER — GABAPENTIN 300 MG PO CAPS: 600.0000 mg | ORAL_CAPSULE | Freq: Two times a day (BID) | ORAL | Status: DC

## 2013-06-24 NOTE — Progress Notes (Signed)
   [address] [place] [address]   Phone [phone]  Subjective:    Patient ID: Jeanne Choi, female    DOB: 22-Aug-1971, 42 y.o.   MRN: [id number]  HPI Pt presents to clinic for med refill.   She is having trouble with weight loss.  She realizes that she cannot do it on her own and has started to looking weight loss clinics.  She is not interested in the surgery.  She is trying to walk about 3 times a day during her breaks but she is not seeing any weight loss.  She has only change a little in her diet - her husband cooks and he does not cook low calorie meals but she admits to minimally trying to change er diet.  She has noticed an increase in her coccyx pain.  She takes neurontin 300mg  when she wakes up and then 600mg  at bedtime - she finds that in the middle of the day her pain is the worst and that is when she has to take the oxycodone.  She has not followed up with the endocrinologist and she does not plan on it because she was not happy with th outcome.  She would like to try to focus on her weight loss and then determine whether some of her symptoms resolve with that.    Review of Systems     Objective:   Physical Exam  Vitals reviewed. Constitutional: She is oriented to person, place, and time. She appears well-developed and well-nourished.  HENT:  Head: Normocephalic and atraumatic.  Right Ear: External ear normal.  Left Ear: External ear normal.  Eyes: Conjunctivae are normal.  Neck: Normal range of motion.  Cardiovascular: Normal rate, regular rhythm and normal heart sounds.   No murmur heard. Pulmonary/Chest: Effort normal and breath sounds normal.  Neurological: She is alert and oriented to person, place, and time.  Skin: Skin is warm and dry.  Psychiatric: She has a normal mood and affect. Her behavior is normal. Judgment and thought content normal.          Assessment & Plan:  Coccyxdynia - Plan: oxyCODONE-acetaminophen (ROXICET) 5-325 MG per tablet,  gabapentin (NEURONTIN) 300 MG capsule (1 upon waking, 1 in mid day and 2 qhs). - we will add a dose of Neurontin in the midday to see if that will decrease her oxycodone use.  PCOS (polycystic ovarian syndrome) - Plan: spironolactone (ALDACTONE) 25 MG tablet, metFORMIN (GLUCOPHAGE-XR) 500 MG 24 hr tablet  We will plan on patient having a CPE in about 3 months when we will see how her weight loss is going and do labs.  She understands and agrees with the above.  Spent about 45 mins with the patient counseling her on weight loss.  I think that the patient is correct and that she cannot do it alone.  We spoke about getting her husbands 100% support at home because he is the cooker.  They work 3rd shift and she uses that as an excuse for not exercising.  We discussed weight loss surgery but she is not interested as she has had 2 family members fail with the lapband.  She will try physician assisted weight loss and then maybe [facility] in the future.  Benny Lennert PA-C 06/24/2013 9:53 AM

## 2013-06-27 NOTE — Progress Notes (Signed)
Left message regarding scheduling physical for patient.

## 2013-07-31 ENCOUNTER — Other Ambulatory Visit: Payer: Self-pay

## 2013-07-31 DIAGNOSIS — M533 Sacrococcygeal disorders, not elsewhere classified: Secondary | ICD-10-CM

## 2013-07-31 MED ORDER — OXYCODONE-ACETAMINOPHEN 5-325 MG PO TABS: 1.0000 | ORAL_TABLET | Freq: Three times a day (TID) | ORAL | Status: DC | PRN

## 2013-07-31 NOTE — Telephone Encounter (Signed)
Ready to pick up.  

## 2013-07-31 NOTE — Telephone Encounter (Signed)
Pt requests Rf of Oxycodone. I have pended it.

## 2013-08-01 NOTE — Telephone Encounter (Signed)
LMOM that Rx is ready for p/up 

## 2013-08-29 ENCOUNTER — Telehealth: Payer: Self-pay

## 2013-08-29 DIAGNOSIS — M533 Sacrococcygeal disorders, not elsewhere classified: Secondary | ICD-10-CM

## 2013-08-29 NOTE — Telephone Encounter (Signed)
Patient needs a refill on her hydrocodone dob 07-24-2069

## 2013-08-30 MED ORDER — OXYCODONE-ACETAMINOPHEN 5-325 MG PO TABS: 1.0000 | ORAL_TABLET | Freq: Three times a day (TID) | ORAL | Status: DC | PRN

## 2013-08-30 NOTE — Telephone Encounter (Signed)
Hydrocodone ?  I think percocet - I have written the rx

## 2013-08-31 NOTE — Telephone Encounter (Signed)
lmom that rx is ready for pickup.  

## 2013-09-19 ENCOUNTER — Other Ambulatory Visit: Payer: Self-pay | Admitting: Physician Assistant

## 2013-09-30 ENCOUNTER — Telehealth: Payer: Self-pay | Admitting: Family Medicine

## 2013-09-30 DIAGNOSIS — M533 Sacrococcygeal disorders, not elsewhere classified: Secondary | ICD-10-CM

## 2013-09-30 NOTE — Telephone Encounter (Signed)
Patient requesting refill on oxycodone.   I did review her last note 06/24/13. She was supposed to schedule CPE appt in 3 months. I asked if she has scheduled and she has not done so yet. She said she starts Airhart this week and will make appt for need of next month. She has not started weight lose regimen due to it being over the holidays.

## 2013-10-02 ENCOUNTER — Other Ambulatory Visit: Payer: Self-pay | Admitting: Physician Assistant

## 2013-10-02 MED ORDER — OXYCODONE-ACETAMINOPHEN 5-325 MG PO TABS: 1.0000 | ORAL_TABLET | Freq: Three times a day (TID) | ORAL | Status: DC | PRN

## 2013-10-02 NOTE — Telephone Encounter (Signed)
Ready for pick up

## 2013-10-02 NOTE — Telephone Encounter (Signed)
SPoke with pt advised Rx ready to pick up.

## 2013-10-07 ENCOUNTER — Other Ambulatory Visit: Payer: Self-pay | Admitting: Physician Assistant

## 2013-10-26 ENCOUNTER — Telehealth: Payer: Self-pay

## 2013-10-26 DIAGNOSIS — M533 Sacrococcygeal disorders, not elsewhere classified: Secondary | ICD-10-CM

## 2013-10-26 MED ORDER — OXYCODONE-ACETAMINOPHEN 5-325 MG PO TABS: 1.0000 | ORAL_TABLET | Freq: Three times a day (TID) | ORAL | Status: DC | PRN

## 2013-10-26 NOTE — Telephone Encounter (Signed)
Pt is requesting a refill on oxycodone from sarah. Pt also wants to let sarah know she is doing well in week 3 of her diet program.

## 2013-10-26 NOTE — Telephone Encounter (Signed)
Ready.  It is a little earlier than a month - just wanted her to be aware of this.  I am so glad she is doing a program and I hope she is very successful.

## 2013-10-27 NOTE — Telephone Encounter (Signed)
Notified pt ready and gave her Sarah's message.

## 2013-10-29 ENCOUNTER — Other Ambulatory Visit: Payer: Self-pay | Admitting: Physician Assistant

## 2013-12-02 ENCOUNTER — Telehealth: Payer: Self-pay | Admitting: Physician Assistant

## 2013-12-02 DIAGNOSIS — F430 Acute stress reaction: Secondary | ICD-10-CM

## 2013-12-02 DIAGNOSIS — M533 Sacrococcygeal disorders, not elsewhere classified: Secondary | ICD-10-CM

## 2013-12-02 NOTE — Telephone Encounter (Signed)
The patient called to report that she is out of oxycodone and lorazepam that Benny LennertSarah Weber PA-C prescribes for her.  The patient requests rx refill and return call at [phone].

## 2013-12-04 MED ORDER — OXYCODONE-ACETAMINOPHEN 5-325 MG PO TABS: 1.0000 | ORAL_TABLET | Freq: Three times a day (TID) | ORAL | Status: DC | PRN

## 2013-12-04 MED ORDER — LORAZEPAM 1 MG PO TABS: 1.0000 mg | ORAL_TABLET | Freq: Two times a day (BID) | ORAL | Status: DC | PRN

## 2013-12-04 NOTE — Telephone Encounter (Signed)
Patient notified. Understands she can pick this up in office at the front desk.

## 2013-12-04 NOTE — Telephone Encounter (Signed)
Ready

## 2013-12-28 ENCOUNTER — Other Ambulatory Visit: Payer: Self-pay | Admitting: Physician Assistant

## 2014-01-02 ENCOUNTER — Telehealth: Payer: Self-pay

## 2014-01-02 DIAGNOSIS — M533 Sacrococcygeal disorders, not elsewhere classified: Secondary | ICD-10-CM

## 2014-01-02 MED ORDER — OXYCODONE-ACETAMINOPHEN 5-325 MG PO TABS: 1.0000 | ORAL_TABLET | Freq: Three times a day (TID) | ORAL | Status: DC | PRN

## 2014-01-02 NOTE — Telephone Encounter (Signed)
LMOM that rx is up front for p/u 

## 2014-01-02 NOTE — Telephone Encounter (Signed)
Done

## 2014-01-02 NOTE — Telephone Encounter (Signed)
REFILL  oxyCODONE-acetaminophen (ROXICET) 5-325 MG per tablet   [phone]    OKAY TO LEAVE DETAILED MESSAGE ON RECORDER

## 2014-01-03 ENCOUNTER — Other Ambulatory Visit: Payer: Self-pay | Admitting: Physician Assistant

## 2014-01-09 ENCOUNTER — Other Ambulatory Visit: Payer: Self-pay | Admitting: Internal Medicine

## 2014-01-29 ENCOUNTER — Telehealth: Payer: Self-pay

## 2014-01-29 DIAGNOSIS — M533 Sacrococcygeal disorders, not elsewhere classified: Secondary | ICD-10-CM

## 2014-01-29 DIAGNOSIS — F430 Acute stress reaction: Secondary | ICD-10-CM

## 2014-01-29 MED ORDER — LORAZEPAM 1 MG PO TABS: 1.0000 mg | ORAL_TABLET | Freq: Two times a day (BID) | ORAL | Status: DC | PRN

## 2014-01-29 MED ORDER — OXYCODONE-ACETAMINOPHEN 5-325 MG PO TABS: 1.0000 | ORAL_TABLET | Freq: Three times a day (TID) | ORAL | Status: DC | PRN

## 2014-01-29 NOTE — Telephone Encounter (Signed)
Notified pt on VM Rx ready and need for CPE/labs.

## 2014-01-29 NOTE — Telephone Encounter (Signed)
Rx's authorized and printed on Jeanne Choi's behalf.  Please remind the patient that she is due to a follow-up with Jeanne Choi (they planned a CPE and fasting labs)

## 2014-01-29 NOTE — Telephone Encounter (Signed)
Weber.  Lorazepam has a couple left but has 4 left to last her. She would like this refilled along with Oxycodone. Call when ready for pick up. Thank you  [phone]- cell phone;patient says  please leave voicemail.

## 2014-01-30 ENCOUNTER — Other Ambulatory Visit: Payer: Self-pay | Admitting: Physician Assistant

## 2014-02-07 ENCOUNTER — Other Ambulatory Visit: Payer: Self-pay | Admitting: Physician Assistant

## 2014-02-27 ENCOUNTER — Ambulatory Visit (INDEPENDENT_AMBULATORY_CARE_PROVIDER_SITE_OTHER): Payer: 59 | Admitting: Physician Assistant

## 2014-02-27 VITALS — BP 126/93 | HR 65 | Temp 97.8°F | Resp 18 | Wt 237.0 lb

## 2014-02-27 DIAGNOSIS — E282 Polycystic ovarian syndrome: Secondary | ICD-10-CM

## 2014-02-27 DIAGNOSIS — R7309 Other abnormal glucose: Secondary | ICD-10-CM

## 2014-02-27 DIAGNOSIS — M533 Sacrococcygeal disorders, not elsewhere classified: Secondary | ICD-10-CM

## 2014-02-27 DIAGNOSIS — R739 Hyperglycemia, unspecified: Secondary | ICD-10-CM

## 2014-02-27 DIAGNOSIS — I10 Essential (primary) hypertension: Secondary | ICD-10-CM

## 2014-02-27 DIAGNOSIS — R634 Abnormal weight loss: Secondary | ICD-10-CM

## 2014-02-27 LAB — COMPLETE METABOLIC PANEL WITH GFR
ALBUMIN: 4.5 g/dL (ref 3.5–5.2)
ALT: 31 U/L (ref 0–35)
AST: 27 U/L (ref 0–37)
Alkaline Phosphatase: 68 U/L (ref 39–117)
BUN: 16 mg/dL (ref 6–23)
CALCIUM: 9.9 mg/dL (ref 8.4–10.5)
CHLORIDE: 100 meq/L (ref 96–112)
CO2: 22 meq/L (ref 19–32)
Creat: 0.67 mg/dL (ref 0.50–1.10)
GFR, Est Non African American: >89 mL/min
Glucose, Bld: 85 mg/dL (ref 70–99)
POTASSIUM: 4.4 meq/L (ref 3.5–5.3)
Sodium: 135 mEq/L (ref 135–145)
Total Bilirubin: 0.4 mg/dL (ref 0.2–1.2)
Total Protein: 7.4 g/dL (ref 6.0–8.3)

## 2014-02-27 LAB — TSH: TSH: 3.396 u[IU]/mL (ref 0.350–4.500)

## 2014-02-27 LAB — LIPID PANEL
CHOL/HDL RATIO: 4.2 ratio
Cholesterol: 193 mg/dL (ref 0–200)
HDL: 46 mg/dL (ref >39)
LDL Cholesterol: 116 mg/dL — ABNORMAL HIGH (ref 0–99)
Triglycerides: 157 mg/dL — ABNORMAL HIGH (ref <150)
VLDL: 31 mg/dL (ref 0–40)

## 2014-02-27 LAB — POCT GLYCOSYLATED HEMOGLOBIN (HGB A1C): HEMOGLOBIN A1C: 5.3

## 2014-02-27 MED ORDER — GABAPENTIN 600 MG PO TABS: 600.0000 mg | ORAL_TABLET | Freq: Three times a day (TID) | ORAL | Status: DC

## 2014-02-27 MED ORDER — METFORMIN HCL ER 500 MG PO TB24: 500.0000 mg | ORAL_TABLET | Freq: Every day | ORAL | Status: DC

## 2014-02-27 MED ORDER — OXYCODONE-ACETAMINOPHEN 5-325 MG PO TABS: 1.0000 | ORAL_TABLET | Freq: Three times a day (TID) | ORAL | Status: DC | PRN

## 2014-02-27 MED ORDER — SPIRONOLACTONE 25 MG PO TABS: 25.0000 mg | ORAL_TABLET | Freq: Every day | ORAL | Status: DC

## 2014-02-27 NOTE — Progress Notes (Signed)
   Subjective:    Patient ID: Jeanne Choi, female    DOB: 08/31/1971, 43 y.o.   MRN: [id number]  HPI  Pt presents to clinic for med refill and labs due to weight loss with low calorie Hcg diet pills through a weight loss center.  She is following a diet and drinking only water and about 500 calories a day. She is not exercising.  She is happy with her weight loss results.  She is still having a lot of burning pain at her coccyx - it has always gotten worse with sweating during the summer - she has not noticed a decrease in her pain with her weight loss.  She has pain that will sometimes wake her up with pain.  Review of Systems     Objective:   Physical Exam  Vitals reviewed. Constitutional: She is oriented to person, place, and time. She appears well-developed and well-nourished.  HENT:  Head: Normocephalic and atraumatic.  Right Ear: External ear normal.  Left Ear: External ear normal.  Cardiovascular: Normal rate, regular rhythm and normal heart sounds.   No murmur heard. Pulmonary/Chest: Effort normal and breath sounds normal. She has no wheezes.  Neurological: She is alert and oriented to person, place, and time.  Skin: Skin is warm and dry.  Psychiatric: She has a normal mood and affect. Her behavior is normal. Judgment and thought content normal.   Results for orders placed in visit on 02/27/14  POCT GLYCOSYLATED HEMOGLOBIN (HGB A1C)      Result Value Ref Range   Hemoglobin A1C 5.3         Assessment & Plan:  HTN (hypertension) - Plan: COMPLETE METABOLIC PANEL WITH GFR, Lipid panel  Severe obesity (BMI >= 40)  Loss of weight - planned with  Low calorie diet and HCG supplements - Plan: TSH  PCOS (polycystic ovarian syndrome) - Plan: spironolactone (ALDACTONE) 25 MG tablet, metFORMIN (GLUCOPHAGE-XR) 500 MG 24 hr tablet  Hyperglycemia - Plan: POCT glycosylated hemoglobin (Hb A1C), metFORMIN (GLUCOPHAGE-XR) 500 MG 24 hr tablet  Coccyxdynia - Plan:  oxyCODONE-acetaminophen (ROXICET) 5-325 MG per tablet, gabapentin (NEURONTIN) 600 MG tablet - we will try a change in her neurontin dosing and do a small dose more frequently to see if she has less breakthrough pain -   We will see her back in 3 months due to current weight loss without exercise and 500 calorie diet - she has a week left of her weight loss center diet and we discussed returning to normal eating habits and probably of increased weight gain with her extreme low calorie diet. Benny LennertSarah Tabbatha Bordelon PA-C  [facility] 02/27/2014 11:28 AM

## 2014-02-27 NOTE — Patient Instructions (Signed)
I will contact you with your lab results as soon as they are available.   If you have not heard from me in 2 weeks, please contact me.  The fastest way to get your results is to register for My Chart (see the instructions on the last page of this printout).  With the gabapentin - we will change your dose to 600mg  3x/day - try this for a week and if you are still are having times where you are wakening with pain or from pain add another 300mg  at bedtime - let me know which one works the best

## 2014-03-23 ENCOUNTER — Telehealth: Payer: Self-pay

## 2014-03-23 DIAGNOSIS — F430 Acute stress reaction: Secondary | ICD-10-CM

## 2014-03-23 DIAGNOSIS — M533 Sacrococcygeal disorders, not elsewhere classified: Secondary | ICD-10-CM

## 2014-03-23 DIAGNOSIS — E282 Polycystic ovarian syndrome: Secondary | ICD-10-CM

## 2014-03-23 DIAGNOSIS — R739 Hyperglycemia, unspecified: Secondary | ICD-10-CM

## 2014-03-23 NOTE — Telephone Encounter (Signed)
Patient of Benny LennertSarah Weber. Says she needs refills on oxycodone and lorazepam. Cb# [phone] to leave fully detailed message because she sleeps during the day and may not answer the phone.

## 2014-03-26 MED ORDER — LORAZEPAM 1 MG PO TABS: 1.0000 mg | ORAL_TABLET | Freq: Two times a day (BID) | ORAL | Status: DC | PRN

## 2014-03-26 MED ORDER — OXYCODONE-ACETAMINOPHEN 5-325 MG PO TABS: 1.0000 | ORAL_TABLET | Freq: Three times a day (TID) | ORAL | Status: DC | PRN

## 2014-03-26 NOTE — Telephone Encounter (Signed)
Pt notified that this is ready for p/u 

## 2014-03-26 NOTE — Telephone Encounter (Signed)
Please place up front for the patient to pick-up and let the patient know about it being ready.

## 2014-04-19 ENCOUNTER — Telehealth: Payer: Self-pay

## 2014-04-19 DIAGNOSIS — M533 Sacrococcygeal disorders, not elsewhere classified: Secondary | ICD-10-CM

## 2014-04-19 NOTE — Telephone Encounter (Signed)
PT HAS BEEN TAKING MORE THAT THE REGULAR DOSAGE OF OXYCODONE AND SHE WILL BE OUT BY Saturday. SHE NEEDS A REFILL.

## 2014-04-21 NOTE — Telephone Encounter (Signed)
The patient called to state that she only has one more pill of Oxycodone left and her rx is not due to be refilled until the end of the week; states she discussed this with Benny LennertSarah Weber, PA and needs a refill.  Please call the patient at [phone] patient states that it is ok to leave a message on her voicemail.

## 2014-04-22 NOTE — Telephone Encounter (Signed)
Would someone please write this on my behalf.  Thank you.

## 2014-04-23 MED ORDER — OXYCODONE-ACETAMINOPHEN 5-325 MG PO TABS: 1.0000 | ORAL_TABLET | Freq: Three times a day (TID) | ORAL | Status: DC | PRN

## 2014-04-23 NOTE — Telephone Encounter (Signed)
Called pt to notify Rx ready.LMOM.

## 2014-05-15 ENCOUNTER — Telehealth: Payer: Self-pay

## 2014-05-15 DIAGNOSIS — M533 Sacrococcygeal disorders, not elsewhere classified: Secondary | ICD-10-CM

## 2014-05-15 DIAGNOSIS — F430 Acute stress reaction: Secondary | ICD-10-CM

## 2014-05-15 NOTE — Telephone Encounter (Signed)
Patient is calling to request refills on lorazepam and oxycodone. CB # [phone]

## 2014-05-17 MED ORDER — LORAZEPAM 1 MG PO TABS: 1.0000 mg | ORAL_TABLET | Freq: Two times a day (BID) | ORAL | Status: DC | PRN

## 2014-05-17 MED ORDER — OXYCODONE-ACETAMINOPHEN 5-325 MG PO TABS
1.0000 | ORAL_TABLET | Freq: Three times a day (TID) | ORAL | Status: DC | PRN
Start: 2014-05-17 — End: 2014-06-11

## 2014-05-17 NOTE — Telephone Encounter (Signed)
Notified pt ready. 

## 2014-05-17 NOTE — Telephone Encounter (Signed)
Ready for pick up

## 2014-05-22 ENCOUNTER — Encounter: Payer: Self-pay | Admitting: Physician Assistant

## 2014-06-09 ENCOUNTER — Telehealth: Payer: Self-pay

## 2014-06-09 DIAGNOSIS — M533 Sacrococcygeal disorders, not elsewhere classified: Secondary | ICD-10-CM

## 2014-06-09 NOTE — Telephone Encounter (Signed)
REFILL   oxyCODONE-acetaminophen (ROXICET) 5-325 MG per tablet   [phone]

## 2014-06-10 NOTE — Telephone Encounter (Signed)
The patient was calling to check the status of the oxycodone 5-325 mg RX refill.  The patient requests return call at [phone] when this is ready for pick up.

## 2014-06-12 MED ORDER — OXYCODONE-ACETAMINOPHEN 5-325 MG PO TABS: 1.0000 | ORAL_TABLET | Freq: Three times a day (TID) | ORAL | Status: DC | PRN

## 2014-06-12 NOTE — Telephone Encounter (Signed)
Tried to call pt on cell provided-busy time 3 Rx placed in pick up drawer for pt. Please advise

## 2014-06-12 NOTE — Telephone Encounter (Signed)
It is a little to early.  I will fill early this month but she needs to make these last for a month in the future.

## 2014-06-12 NOTE — Telephone Encounter (Signed)
LMOM for pt that Rx is ready and that it needs to last an entire month next time.

## 2014-06-12 NOTE — Telephone Encounter (Signed)
Pt called again this morning to let us know she is out of her medication, hasn't had since yesterday. Please call pt at [phone]

## 2014-07-07 ENCOUNTER — Ambulatory Visit (INDEPENDENT_AMBULATORY_CARE_PROVIDER_SITE_OTHER): Payer: 59

## 2014-07-07 ENCOUNTER — Ambulatory Visit (INDEPENDENT_AMBULATORY_CARE_PROVIDER_SITE_OTHER): Payer: 59 | Admitting: Family Medicine

## 2014-07-07 VITALS — BP 162/88 | HR 105 | Temp 97.9°F | Resp 19 | Ht 62.5 in | Wt 256.6 lb

## 2014-07-07 DIAGNOSIS — F430 Acute stress reaction: Secondary | ICD-10-CM

## 2014-07-07 DIAGNOSIS — M533 Sacrococcygeal disorders, not elsewhere classified: Secondary | ICD-10-CM

## 2014-07-07 LAB — POCT CBC
Granulocyte percent: 56.8 %G (ref 37–80)
HCT, POC: 43.3 % (ref 37.7–47.9)
Hemoglobin: 13.9 g/dL (ref 12.2–16.2)
LYMPH, POC: 4.7 — AB (ref 0.6–3.4)
MCH, POC: 29.7 pg (ref 27–31.2)
MCHC: 32.1 g/dL (ref 31.8–35.4)
MCV: 92.5 fL (ref 80–97)
MID (CBC): 0.1 (ref 0–0.9)
MPV: 8.4 fL (ref 0–99.8)
PLATELET COUNT, POC: 304 10*3/uL (ref 142–424)
POC GRANULOCYTE: 6.4 (ref 2–6.9)
POC LYMPH %: 42 % (ref 10–50)
POC MID %: 1.2 %M (ref 0–12)
RBC: 4.68 M/uL (ref 4.04–5.48)
RDW, POC: 14.1 %
WBC: 11.3 10*3/uL — AB (ref 4.6–10.2)

## 2014-07-07 LAB — POCT SEDIMENTATION RATE: POCT SED RATE: 50 mm/hr — AB (ref 0–22)

## 2014-07-07 MED ORDER — LORAZEPAM 1 MG PO TABS: 1.0000 mg | ORAL_TABLET | Freq: Two times a day (BID) | ORAL | Status: DC | PRN

## 2014-07-07 MED ORDER — DICLOFENAC SODIUM 75 MG PO TBEC: 75.0000 mg | DELAYED_RELEASE_TABLET | Freq: Two times a day (BID) | ORAL | Status: DC

## 2014-07-07 MED ORDER — PREDNISONE 20 MG PO TABS: 40.0000 mg | ORAL_TABLET | Freq: Every day | ORAL | Status: DC

## 2014-07-07 MED ORDER — OXYCODONE-ACETAMINOPHEN 5-325 MG PO TABS: 1.0000 | ORAL_TABLET | Freq: Three times a day (TID) | ORAL | Status: DC | PRN

## 2014-07-07 NOTE — Progress Notes (Signed)
   Subjective:    Patient ID: Jeanne Choi, female    DOB: 02/13/1971, 43 y.o.   MRN: [id number]  HPI Pt presents to clinic for a recheck and increase in her pain at her coccyx area.  This pain has been going on for 10 years but over the last 4 months the pain has seemed to get worse.  She feels like she has changed nothing in regards to her lifestyle.  She was able to lose about 30 lbs but did not feel that it made a difference.  She has gained about half of that back because she got frustrated and gave up on her weight loss efforts.  She has noticed that the pain is worse with constipation and during passing a hard stool.  She does use Aleve daily.  She sits on a pillow with coccyx cut out and there is not other way that she could sit without aggravating her pain.  She is getting frustrating because she does not want to be on pain medications but her pain is getting worse.   Her sleep is being affected more due to the pain and she is having to take Ativan nightly to help her sleep.  Review of Systems     Objective:   Physical Exam  Vitals reviewed. Constitutional: She is oriented to person, place, and time. She appears well-developed and well-nourished.  BP 162/88  Pulse 105  Temp(Src) 97.9 F (36.6 C) (Oral)  Resp 19  Ht 5' 2.5" (1.588 m)  Wt 256 lb 9.6 oz (116.393 kg)  BMI 46.16 kg/m2  SpO2 98%   HENT:  Head: Normocephalic and atraumatic.  Right Ear: External ear normal.  Left Ear: External ear normal.  Pulmonary/Chest: Effort normal.  Musculoskeletal:  Scar from pilonidal surgery - no tenderness.  Signifiant TTP over coccyx in about a 2 area - central location - no erythema or swelling noted.  Neurological: She is alert and oriented to person, place, and time.  Skin: Skin is warm and dry.  Psychiatric: She has a normal mood and affect. Her behavior is normal. Judgment and thought content normal.   [facility] reading (PRIMARY) by  Dr. Milus GlazierLauenstein. neg     Assessment & Plan:    Coccyx pain - Plan: DG Sacrum/Coccyx, POCT CBC, POCT SEDIMENTATION RATE, predniSONE (DELTASONE) 20 MG tablet, diclofenac (VOLTAREN) 75 MG EC tablet  Coccyxdynia - Plan: oxyCODONE-acetaminophen (ROXICET) 5-325 MG per tablet, Ambulatory referral to Orthopedic Surgery  Stress reaction - Plan: LORazepam (ATIVAN) 1 MG tablet  I am concerned that the patient is having more pain than usual around her coccyx.  The xray appears normal though difficult due to her size.  Her weight is absolutely not helping her pain and she was doing well her weight loss but currently has stopped but she has plans to restart her efforts.  We will continue pain medications but add prednisone and NSAID afterwards for possible antiinflammatory efforts.  We will send her to ortho for possible more imaging (? Need for MRI) and possible injection to the area for pain control.   Benny LennertSarah Weber PA-C  [facility] 07/07/2014 11:41 AM

## 2014-07-16 ENCOUNTER — Other Ambulatory Visit: Payer: Self-pay | Admitting: Orthopedic Surgery

## 2014-07-16 DIAGNOSIS — M533 Sacrococcygeal disorders, not elsewhere classified: Secondary | ICD-10-CM

## 2014-07-22 ENCOUNTER — Ambulatory Visit
Admission: RE | Admit: 2014-07-22 | Discharge: 2014-07-22 | Disposition: A | Discharge status: Home / Self Care | Payer: 59 | Source: Ambulatory Visit | Attending: Orthopedic Surgery | Admitting: Orthopedic Surgery

## 2014-07-22 DIAGNOSIS — M533 Sacrococcygeal disorders, not elsewhere classified: Secondary | ICD-10-CM

## 2014-08-01 ENCOUNTER — Telehealth: Payer: Self-pay | Admitting: Physician Assistant

## 2014-08-01 DIAGNOSIS — M533 Sacrococcygeal disorders, not elsewhere classified: Secondary | ICD-10-CM

## 2014-08-01 NOTE — Telephone Encounter (Signed)
Not a good idea to refill these controlled medications from our facility.  Recommend she follow up with her PCP

## 2014-08-01 NOTE — Telephone Encounter (Signed)
Refills on oxyCODONE-acetaminophen (ROXICET) 5-325 MG per tablet [[id number]]  And   LORa[[id number]]zepam (ATIVAN) 1 MG tablet [[id number]][[id number]]      [phone]

## 2014-08-03 MED ORDER — OXYCODONE-ACETAMINOPHEN 5-325 MG PO TABS: 1.0000 | ORAL_TABLET | Freq: Three times a day (TID) | ORAL | Status: DC | PRN

## 2014-08-03 NOTE — Telephone Encounter (Signed)
Please call pt - She had an appt on 11/2 with Dr Charlett BlakeVoytek - please get me information about this appt from the patient. I can refill her medications.

## 2014-08-03 NOTE — Telephone Encounter (Signed)
LM for rtn call. 

## 2014-08-03 NOTE — Telephone Encounter (Signed)
Rx is in drawer for p/up. Get info for [name] below.

## 2014-08-04 ENCOUNTER — Other Ambulatory Visit: Payer: Self-pay | Admitting: Physician Assistant

## 2014-08-04 DIAGNOSIS — F430 Acute stress reaction: Secondary | ICD-10-CM

## 2014-08-04 MED ORDER — LORAZEPAM 1 MG PO TABS: 1.0000 mg | ORAL_TABLET | Freq: Two times a day (BID) | ORAL | Status: DC | PRN

## 2014-08-04 NOTE — Telephone Encounter (Signed)
Pt has picked up her Rx.  I still need someone to get in contact with her about her appt with ortho doctor.

## 2014-08-06 NOTE — Telephone Encounter (Signed)
LM for rtn call. 

## 2014-08-07 NOTE — Telephone Encounter (Signed)
Lm for rtn call 

## 2014-08-23 ENCOUNTER — Telehealth: Payer: Self-pay

## 2014-08-23 DIAGNOSIS — M533 Sacrococcygeal disorders, not elsewhere classified: Secondary | ICD-10-CM

## 2014-08-23 MED ORDER — OXYCODONE-ACETAMINOPHEN 5-325 MG PO TABS: 1.0000 | ORAL_TABLET | Freq: Three times a day (TID) | ORAL | Status: DC | PRN

## 2014-08-23 NOTE — Telephone Encounter (Signed)
Done

## 2014-08-23 NOTE — Telephone Encounter (Signed)
Patient is requesting a refill on her "Oxycodone". Patient stated she is aware she is due to come in but can only come in on the weekend. She only wants to see Benny LennertSarah Weber and stated she would come in on 09/01/14 to see her at the walk in clinic. She only has enough medication till this Sunday 08/26/14. Please call patient at [phone] when ready to be picked up

## 2014-08-24 NOTE — Telephone Encounter (Signed)
Pt advised.

## 2014-09-01 ENCOUNTER — Ambulatory Visit (INDEPENDENT_AMBULATORY_CARE_PROVIDER_SITE_OTHER): Payer: 59 | Admitting: Family Medicine

## 2014-09-01 ENCOUNTER — Encounter: Payer: Self-pay | Admitting: Physician Assistant

## 2014-09-01 VITALS — BP 138/93 | HR 84 | Temp 97.9°F | Resp 18 | Wt 265.0 lb

## 2014-09-01 DIAGNOSIS — Z872 Personal history of diseases of the skin and subcutaneous tissue: Secondary | ICD-10-CM

## 2014-09-01 DIAGNOSIS — L0591 Pilonidal cyst without abscess: Secondary | ICD-10-CM

## 2014-09-01 DIAGNOSIS — M533 Sacrococcygeal disorders, not elsewhere classified: Secondary | ICD-10-CM

## 2014-09-01 DIAGNOSIS — E282 Polycystic ovarian syndrome: Secondary | ICD-10-CM

## 2014-09-01 MED ORDER — DOXYCYCLINE HYCLATE 100 MG PO TABS: 100.0000 mg | ORAL_TABLET | Freq: Two times a day (BID) | ORAL | Status: DC

## 2014-09-01 MED ORDER — OXYCODONE-ACETAMINOPHEN 5-325 MG PO TABS: 1.0000 | ORAL_TABLET | Freq: Three times a day (TID) | ORAL | Status: DC | PRN

## 2014-09-01 NOTE — Progress Notes (Signed)
Subjective:    Patient ID: Jeanne Choi, female    DOB: 05/06/1971, 43 y.o.   MRN: [id number]  HPI Pt presents to clinic for f/u of her coccxydynia.  The pain is better than what it was at her last visit.  She feels like the voltaren is helping with her pain.  She is having to take 1/2 percocet to 2 pills a day depending on her pain and feels like without the voltaren she would be taking more pills a day to help with her pain.  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  Long soaks in warm water help the pain.  The area of pain is not where her past incisions are located from her pilonidal I&D and excision it is lower than that.   She did not find that the prednisone helped that much with her pain.  She did notice that she started to swell in hands and feet with the prednisone and that has not really resolved since going off the prednisone.  She saw Dr Charlett BlakeVoytek on 11/2 - MRI showed small 21x699mm cystic lesion over coccyx - no f/u planned per the call she received from the office.   Patient Active Problem List   Diagnosis Date Noted  . Obesity, Class III, BMI 40-49.9 (morbid obesity) 04/01/2013  . 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 PCOS (polycystic ovarian syndrome) 07/13/2012   Prior to Admission medications   Medication Sig Start Date End Date Taking? Authorizing Provider  diclofenac (VOLTAREN) 75 MG EC tablet Take 75 mg by mouth 2 (two) times daily.   Yes Historical Provider, MD  gabapentin (NEURONTIN) 600 MG tablet Take 1 tablet (600 mg total) by mouth 3 (three) times daily. Patient taking differently: Take 600 mg by mouth 2 (two) times daily.  02/27/14  Yes Morrell RiddleSarah L Eshani Maestre, PA-C  levonorgestrel (MIRENA) 20 MCG/24HR IUD 1 each by Intrauterine route once.   Yes Historical Provider, MD  LORazepam (ATIVAN) 1 MG  tablet Take 1 tablet (1 mg total) by mouth 2 (two) times daily as needed for anxiety. 08/04/14  Yes Morrell RiddleSarah L Joyia Riehle, PA-C  oxyCODONE-acetaminophen (ROXICET) 5-325 MG per tablet Take 1 tablet by mouth every 8 (eight) hours as needed. 09/01/14  Yes Morrell RiddleSarah L Jozeph Persing, PA-C  spironolactone (ALDACTONE) 25 MG tablet Take 1 tablet (25 mg total) by mouth daily. 02/27/14  Yes Morrell RiddleSarah L Lewellyn Fultz, PA-C   Allergies  Allergen Reactions  . Iodine Rash    Medications, allergies, past medical history, surgical history, family history, social history and problem list reviewed and updated.  Review of Systems     Objective:   Physical Exam  Constitutional: She is oriented to person, place, and time. She appears well-developed and well-nourished.  BP 138/93 mmHg  Pulse 84  Temp(Src) 97.9 F (36.6 C) (Oral)  Resp 18  Wt 265 lb (120.203 kg)  SpO2 98%   Pulmonary/Chest: Effort normal.  Genitourinary: Rectal exam shows tenderness (internal coccyx tenderness to palpation but she has more pain with enternal palpation of coccyx). Rectal exam shows no mass.     Neurological: She is alert and oriented to person, place, and time.  Skin: Skin is warm and dry.  Psychiatric: She has a normal mood and affect. Her behavior is normal. Judgment and thought content normal.  Assessment & Plan:  Coccyxdynia - Plan: oxyCODONE-acetaminophen (ROXICET) 5-325 MG per tablet, Ambulatory referral to General Surgery, doxycycline (VIBRA-TABS) 100 MG tablet  Obesity, Class III, BMI 40-49.9 (morbid obesity)   Due to the MRI results that show a cystic lesion dorsal to her coccyx we will send her to the surgeon - I have to wonder if there is nerve involvement due to the burning pain that she has in that area.  An injection to the area might help but with the possibility of infection we want to deal with that 1st.  We will treat with a round an abx while waiting on the surgeon visit to see if that helps her pain.  We will continue to  treat her pain but I would like to find a definitive plan for this patients pain because she is young and it seems that this problem could be fixed.  Benny LennertSarah Kohle Winner PA-C  [facility] 09/01/2014 3:53 PM

## 2014-09-01 NOTE — Patient Instructions (Addendum)
Increase your Spironolactone to 2 pills when you wake up.  Continue to use diclofenac and let me know if the swelling continues    Please also monitor your blood pressure  shoes [employer]**

## 2014-09-03 NOTE — Progress Notes (Signed)
Discussed in detail with Benny LennertSarah Weber PA-C.  It sounds like referral is best.  Agree with plan.

## 2014-09-08 ENCOUNTER — Other Ambulatory Visit: Payer: Self-pay | Admitting: Physician Assistant

## 2014-09-10 ENCOUNTER — Telehealth: Payer: Self-pay

## 2014-09-10 ENCOUNTER — Other Ambulatory Visit: Payer: Self-pay | Admitting: Physician Assistant

## 2014-09-10 DIAGNOSIS — E282 Polycystic ovarian syndrome: Secondary | ICD-10-CM

## 2014-09-10 NOTE — Telephone Encounter (Signed)
Jeanne Choi, pt was just in but don't see this med discussed. Do you want to give RFs?

## 2014-09-10 NOTE — Telephone Encounter (Signed)
The patient called about medication refills for spironolactone (ALDACTONE) 25 MG tablet and diclofenac (VOLTAREN) 75 MG EC tablet.  She said that she ran out of the spironolactone because she was told to start taking 2 a day instead of 1.  The patient said that the pharmacy sent a request to Benny LennertSarah Weber, but she thought that she was on vacation and would not see it.  Please advise.  Thank you.  CB#: [phone]

## 2014-09-11 ENCOUNTER — Encounter: Payer: Self-pay | Admitting: Physician Assistant

## 2014-09-11 MED ORDER — SPIRONOLACTONE 25 MG PO TABS: 50.0000 mg | ORAL_TABLET | Freq: Every day | ORAL | Status: DC

## 2014-09-11 MED ORDER — DICLOFENAC SODIUM 75 MG PO TBEC: 75.0000 mg | DELAYED_RELEASE_TABLET | Freq: Two times a day (BID) | ORAL | Status: DC

## 2014-09-11 NOTE — Telephone Encounter (Signed)
Jeanne SagoSarah, do you want to give more RFs? Looks like there was a problem with the general surgery referral.

## 2014-09-11 NOTE — Telephone Encounter (Signed)
Ok

## 2014-09-11 NOTE — Telephone Encounter (Signed)
Sent in refill for aldactone with new directions per last OV reviewed.  Please advise on Diclofenac.

## 2014-09-25 ENCOUNTER — Telehealth: Payer: Self-pay | Admitting: Physician Assistant

## 2014-09-25 NOTE — Telephone Encounter (Signed)
Can we please call pt and make sure she knows about her appt with the surgeon on 1/14.

## 2014-09-25 NOTE — Telephone Encounter (Signed)
I spoke with patient.

## 2014-10-02 ENCOUNTER — Encounter: Payer: Self-pay | Admitting: Physician Assistant

## 2014-10-02 ENCOUNTER — Other Ambulatory Visit: Payer: Self-pay | Admitting: Physician Assistant

## 2014-10-02 DIAGNOSIS — M533 Sacrococcygeal disorders, not elsewhere classified: Secondary | ICD-10-CM

## 2014-10-02 NOTE — Progress Notes (Unsigned)
Sp[facility] with [facility]ong[facility] and he thinks that he might be able to inject the area where she is having pain.  I have LMOM and sent her a mychart message regarding her appt on Thursday.

## 2014-10-04 ENCOUNTER — Telehealth: Payer: Self-pay

## 2014-10-04 DIAGNOSIS — F430 Acute stress reaction: Secondary | ICD-10-CM

## 2014-10-04 MED ORDER — LORAZEPAM 1 MG PO TABS: 1.0000 mg | ORAL_TABLET | Freq: Two times a day (BID) | ORAL | Status: DC | PRN

## 2014-10-04 NOTE — Telephone Encounter (Signed)
LORazepam (ATIVAN) 1 MG tablet   REFILL REQUEST   [facility]   [phone]

## 2014-10-04 NOTE — Telephone Encounter (Signed)
Done and ready 

## 2014-10-07 NOTE — Telephone Encounter (Signed)
Rx in pick up drawer. Pt advised. 

## 2014-10-09 ENCOUNTER — Other Ambulatory Visit: Payer: Self-pay | Admitting: Physician Assistant

## 2014-10-21 ENCOUNTER — Telehealth: Payer: Self-pay

## 2014-10-21 DIAGNOSIS — M533 Sacrococcygeal disorders, not elsewhere classified: Secondary | ICD-10-CM

## 2014-10-21 DIAGNOSIS — F430 Acute stress reaction: Secondary | ICD-10-CM

## 2014-10-21 NOTE — Telephone Encounter (Signed)
Pt called wanting a refill on oxyCODONE-acetaminophen (ROXICET) 5-325 MG per tablet [[id number] and LORazepam (ATIVAN) 1 MG tablet [[id number]][[id number]]. CB#[phone]

## 2014-10-23 MED ORDER — OXYCODONE-ACETAMINOPHEN 5-325 MG PO TABS: 1.0000 | ORAL_TABLET | Freq: Three times a day (TID) | ORAL | Status: DC | PRN

## 2014-10-23 MED ORDER — LORAZEPAM 1 MG PO TABS: 1.0000 mg | ORAL_TABLET | Freq: Two times a day (BID) | ORAL | Status: DC | PRN

## 2014-10-23 NOTE — Telephone Encounter (Signed)
I have refilled her medications.  I see that her pain medication usage has decreased - what is the long term plan for her pain with Dr Regino SchultzeWang.

## 2014-10-24 NOTE — Telephone Encounter (Signed)
Left message for pt to call back  °

## 2014-10-26 NOTE — Telephone Encounter (Signed)
LMOM for pt to CB, question from Sarah below.

## 2014-11-13 ENCOUNTER — Other Ambulatory Visit: Payer: Self-pay | Admitting: Physician Assistant

## 2014-11-14 ENCOUNTER — Encounter: Payer: Self-pay | Admitting: Physician Assistant

## 2014-11-17 ENCOUNTER — Telehealth: Payer: Self-pay | Admitting: Physician Assistant

## 2014-11-17 DIAGNOSIS — M533 Sacrococcygeal disorders, not elsewhere classified: Secondary | ICD-10-CM

## 2014-11-18 NOTE — Telephone Encounter (Signed)
Patient states she will be out of her oxycodone tonight and would like for someone to write a refill. She is a patient of Benny LennertSarah Weber, PA-C. She knows [name] comes in on Tuesday but she can't wait that long. She sent a mychart message but hasn't gotten a response back yet. Cb# [phone].

## 2014-11-19 ENCOUNTER — Telehealth: Payer: Self-pay

## 2014-11-19 MED ORDER — OXYCODONE-ACETAMINOPHEN 5-325 MG PO TABS: 1.0000 | ORAL_TABLET | Freq: Three times a day (TID) | ORAL | Status: DC | PRN

## 2014-11-19 NOTE — Telephone Encounter (Signed)
Left message letting pt know, Rx ready to pick up.

## 2014-11-19 NOTE — Telephone Encounter (Signed)
Patient notified via My Chart.  Meds ordered this encounter  Medications  . oxyCODONE-acetaminophen (ROXICET) 5-325 MG per tablet    Sig: Take 1 tablet by mouth every 8 (eight) hours as needed.    Dispense:  45 tablet    Refill:  0    Order Specific Question:  Supervising Provider    Answer:  DOOLITTLE, ROBERT P [[id number]]

## 2014-11-19 NOTE — Telephone Encounter (Signed)
I signed in her medication in our RX Pickup box. She signed and took her med with her. Gg

## 2014-11-19 NOTE — Telephone Encounter (Signed)
Can someone help with this? 

## 2014-12-13 ENCOUNTER — Telehealth: Payer: Self-pay

## 2014-12-13 DIAGNOSIS — M533 Sacrococcygeal disorders, not elsewhere classified: Secondary | ICD-10-CM

## 2014-12-13 MED ORDER — OXYCODONE-ACETAMINOPHEN 5-325 MG PO TABS: 1.0000 | ORAL_TABLET | Freq: Three times a day (TID) | ORAL | Status: DC | PRN

## 2014-12-13 NOTE — Telephone Encounter (Signed)
Pt requesting refill on oxyCODONE-acetaminophen (ROXICET) 5-325 MG per tablet [[id number]][[id number]], states she will be out on Saturday

## 2014-12-13 NOTE — Telephone Encounter (Signed)
I have refilled her meds but I need to know what her plans are for her pain.

## 2014-12-14 NOTE — Telephone Encounter (Signed)
Rx in pick up draw. Left message for pt to call back.  

## 2014-12-17 ENCOUNTER — Encounter: Payer: Self-pay | Admitting: Physician Assistant

## 2014-12-17 NOTE — Telephone Encounter (Signed)
Left message to CB

## 2014-12-17 NOTE — Telephone Encounter (Signed)
She states she was planning to see a specialist but they want her to get another MRI which cost her a lot of money. She has used all of her money in her [employer] account. She would like [name] to email; her because when we call we are waking her up and she is not getting any sleep due to being "woken up". Please email pt. Thanks.

## 2015-01-09 ENCOUNTER — Telehealth: Payer: Self-pay

## 2015-01-09 DIAGNOSIS — M533 Sacrococcygeal disorders, not elsewhere classified: Secondary | ICD-10-CM

## 2015-01-09 NOTE — Telephone Encounter (Signed)
Pt requesting refill on oxyCODONE-acetaminophen (ROXICET) 5-325 MG per tablet [[id number]][[id number]]

## 2015-01-11 MED ORDER — OXYCODONE-ACETAMINOPHEN 5-325 MG PO TABS: 1.0000 | ORAL_TABLET | Freq: Three times a day (TID) | ORAL | Status: DC | PRN

## 2015-01-11 NOTE — Telephone Encounter (Signed)
Left message Rx ready to pick up. 

## 2015-01-11 NOTE — Telephone Encounter (Signed)
Rx ready.

## 2015-01-22 ENCOUNTER — Other Ambulatory Visit: Payer: Self-pay | Admitting: Physician Assistant

## 2015-01-22 NOTE — Telephone Encounter (Signed)
Ready

## 2015-01-25 NOTE — Telephone Encounter (Signed)
Rx phoned in.   

## 2015-02-04 ENCOUNTER — Telehealth: Payer: Self-pay

## 2015-02-04 DIAGNOSIS — M533 Sacrococcygeal disorders, not elsewhere classified: Secondary | ICD-10-CM

## 2015-02-04 NOTE — Telephone Encounter (Signed)
Pt would like a refill on her oxyCODONE-acetaminophen (ROXICET) 5-325 MG per tablet [[id number]][[id number]] script. Please advise at [phone]

## 2015-02-06 MED ORDER — OXYCODONE-ACETAMINOPHEN 5-325 MG PO TABS: 1.0000 | ORAL_TABLET | Freq: Three times a day (TID) | ORAL | Status: DC | PRN

## 2015-02-06 NOTE — Telephone Encounter (Signed)
Rx in drawer. 

## 2015-02-06 NOTE — Telephone Encounter (Signed)
Done.  Pt knows through mychart 

## 2015-03-06 ENCOUNTER — Telehealth: Payer: Self-pay

## 2015-03-06 DIAGNOSIS — M533 Sacrococcygeal disorders, not elsewhere classified: Secondary | ICD-10-CM

## 2015-03-06 MED ORDER — OXYCODONE-ACETAMINOPHEN 5-325 MG PO TABS: 1.0000 | ORAL_TABLET | Freq: Three times a day (TID) | ORAL | Status: DC | PRN

## 2015-03-06 NOTE — Telephone Encounter (Signed)
Ready for pick up

## 2015-03-06 NOTE — Telephone Encounter (Signed)
Refill  oxyCODONE-acetaminophen (ROXICET) 5-325 MG per tablet   OKAY to L[facility]  [phone]

## 2015-03-07 NOTE — Telephone Encounter (Signed)
Left message Rx ready to pick up. 

## 2015-03-20 ENCOUNTER — Other Ambulatory Visit: Payer: Self-pay | Admitting: Physician Assistant

## 2015-04-02 ENCOUNTER — Telehealth: Payer: Self-pay

## 2015-04-02 DIAGNOSIS — M533 Sacrococcygeal disorders, not elsewhere classified: Secondary | ICD-10-CM

## 2015-04-02 MED ORDER — OXYCODONE-ACETAMINOPHEN 5-325 MG PO TABS: 1.0000 | ORAL_TABLET | Freq: Three times a day (TID) | ORAL | Status: DC | PRN

## 2015-04-02 MED ORDER — LORAZEPAM 1 MG PO TABS
1.0000 mg | ORAL_TABLET | Freq: Two times a day (BID) | ORAL | Status: DC | PRN
Start: 2015-04-02 — End: 2015-07-30

## 2015-04-02 NOTE — Telephone Encounter (Signed)
Pt need refill on her OXYCODONE 5-325 MG and LORAZEPAM. Will be out of her LORAZEPAM by Saturday. Please call [phone]

## 2015-04-02 NOTE — Telephone Encounter (Signed)
Done

## 2015-04-23 ENCOUNTER — Telehealth: Payer: Self-pay

## 2015-04-23 DIAGNOSIS — M533 Sacrococcygeal disorders, not elsewhere classified: Secondary | ICD-10-CM

## 2015-04-23 NOTE — Telephone Encounter (Addendum)
Pt called in and needs a refill for Oxycodone and Diclofenac by Friday. She can be reached @ [phone] please leave message she sleeps during the day. Thank you

## 2015-04-25 MED ORDER — DICLOFENAC SODIUM 75 MG PO TBEC: 75.0000 mg | DELAYED_RELEASE_TABLET | Freq: Two times a day (BID) | ORAL | Status: DC

## 2015-04-25 MED ORDER — OXYCODONE-ACETAMINOPHEN 5-325 MG PO TABS: 1.0000 | ORAL_TABLET | Freq: Three times a day (TID) | ORAL | Status: DC | PRN

## 2015-04-25 NOTE — Telephone Encounter (Signed)
Done

## 2015-04-26 NOTE — Telephone Encounter (Signed)
Advised pt

## 2015-05-15 ENCOUNTER — Telehealth: Payer: Self-pay

## 2015-05-15 DIAGNOSIS — M533 Sacrococcygeal disorders, not elsewhere classified: Secondary | ICD-10-CM

## 2015-05-15 NOTE — Telephone Encounter (Signed)
Pt requesting Oxychodone refill,  Best phone [phone]

## 2015-05-16 MED ORDER — OXYCODONE-ACETAMINOPHEN 5-325 MG PO TABS: 1.0000 | ORAL_TABLET | Freq: Three times a day (TID) | ORAL | Status: DC | PRN

## 2015-05-16 NOTE — Telephone Encounter (Signed)
Her use of the pain medication has increased - she is requesting this a week early - I need to know what her plan is - when can she see the MD in [place]? I will fill it this time but she will need an OV before more refills are given

## 2015-05-17 NOTE — Telephone Encounter (Signed)
Rx in pick up draw. Spoke with pt, advised message and agreed to come in to see Maralyn Sago.

## 2015-05-17 NOTE — Telephone Encounter (Signed)
Left message for pt to call back  °

## 2015-05-26 ENCOUNTER — Other Ambulatory Visit: Payer: Self-pay | Admitting: Physician Assistant

## 2015-06-03 ENCOUNTER — Encounter: Payer: Self-pay | Admitting: Physician Assistant

## 2015-06-03 DIAGNOSIS — M533 Sacrococcygeal disorders, not elsewhere classified: Secondary | ICD-10-CM

## 2015-06-05 MED ORDER — OXYCODONE-ACETAMINOPHEN 5-325 MG PO TABS
1.0000 | ORAL_TABLET | Freq: Three times a day (TID) | ORAL | Status: DC | PRN
Start: 2015-06-05 — End: 2015-06-11

## 2015-06-05 NOTE — Telephone Encounter (Signed)
Rx for oxycodone in drawer.

## 2015-06-05 NOTE — Telephone Encounter (Signed)
Pt know through my chart - Rx ready

## 2015-06-11 ENCOUNTER — Ambulatory Visit (INDEPENDENT_AMBULATORY_CARE_PROVIDER_SITE_OTHER): Payer: 59 | Admitting: Physician Assistant

## 2015-06-11 VITALS — BP 112/78 | HR 101 | Temp 97.7°F | Resp 18 | Ht 62.0 in | Wt 296.0 lb

## 2015-06-11 DIAGNOSIS — B373 Candidiasis of vulva and vagina: Secondary | ICD-10-CM | POA: Diagnosis not present

## 2015-06-11 DIAGNOSIS — Z791 Long term (current) use of non-steroidal anti-inflammatories (NSAID): Secondary | ICD-10-CM | POA: Diagnosis not present

## 2015-06-11 DIAGNOSIS — Z6841 Body Mass Index (BMI) 40.0 and over, adult: Secondary | ICD-10-CM

## 2015-06-11 DIAGNOSIS — R739 Hyperglycemia, unspecified: Secondary | ICD-10-CM

## 2015-06-11 DIAGNOSIS — B3731 Acute candidiasis of vulva and vagina: Secondary | ICD-10-CM

## 2015-06-11 DIAGNOSIS — K649 Unspecified hemorrhoids: Secondary | ICD-10-CM

## 2015-06-11 DIAGNOSIS — M533 Sacrococcygeal disorders, not elsewhere classified: Secondary | ICD-10-CM | POA: Diagnosis not present

## 2015-06-11 LAB — COMPLETE METABOLIC PANEL WITH GFR
ALBUMIN: 4.3 g/dL (ref 3.6–5.1)
ALK PHOS: 70 U/L (ref 33–115)
ALT: 25 U/L (ref 6–29)
AST: 20 U/L (ref 10–35)
BILIRUBIN TOTAL: 0.3 mg/dL (ref 0.2–1.2)
BUN: 22 mg/dL (ref 7–25)
CO2: 24 mmol/L (ref 20–31)
CREATININE: 0.61 mg/dL (ref 0.50–1.10)
Calcium: 9.6 mg/dL (ref 8.6–10.2)
Chloride: 104 mmol/L (ref 98–110)
GFR, Est African American: >89 mL/min (ref >=60)
GFR, Est Non African American: >89 mL/min (ref >=60)
GLUCOSE: 144 mg/dL — AB (ref 65–99)
Potassium: 4.4 mmol/L (ref 3.5–5.3)
SODIUM: 137 mmol/L (ref 135–146)
TOTAL PROTEIN: 7.5 g/dL (ref 6.1–8.1)

## 2015-06-11 LAB — GLUCOSE, POCT (MANUAL RESULT ENTRY): POC Glucose: 152 mg/dl — AB (ref 70–99)

## 2015-06-11 LAB — POCT GLYCOSYLATED HEMOGLOBIN (HGB A1C): Hemoglobin A1C: 6.2

## 2015-06-11 MED ORDER — CYCLOBENZAPRINE HCL ER 15 MG PO CP24: 15.0000 mg | ORAL_CAPSULE | Freq: Every day | ORAL | Status: DC | PRN

## 2015-06-11 MED ORDER — FLUCONAZOLE 150 MG PO TABS: 150.0000 mg | ORAL_TABLET | Freq: Once | ORAL | Status: DC

## 2015-06-11 MED ORDER — HYDROCORTISONE 2.5 % RE CREA: 1.0000 "application " | TOPICAL_CREAM | Freq: Two times a day (BID) | RECTAL | Status: DC

## 2015-06-11 MED ORDER — OXYCODONE-ACETAMINOPHEN 5-325 MG PO TABS
1.0000 | ORAL_TABLET | Freq: Three times a day (TID) | ORAL | Status: DC | PRN
Start: 2015-06-11 — End: 2015-07-30

## 2015-06-11 NOTE — Progress Notes (Signed)
Jeanne Choi  MRN: [id number] DOB: [birth year]  Subjective:  Pt presents to clinic for recheck and conversation about her pain control and next step.  She saw Dr Regino Schultze and had a injection but she had no help so he referred her to the [facility] spine [facility] but she never made an appt because they wanted another MRI before her appt and she could not afford it at the time.  She still thinks that she might be interested in this but still cannot afford the MRI and the appointment.  She is frustrated with her weight loss efforts with [facility] weight loss.  She loss about 30 #  but has gained it all back plus 30 #.  She cannot exercise weight bearing because her back and foot have been hurting.  She is eating about 3000 calories a day - cannot see a nutritionist because her insurance will not pay for it.  Eats a lot of carbs.  appt with neurologist end of October - [place] - she thought that she would try this route because it is a nerve pain   Gabapentin - helps the throbbing and pressure pain but not the hot needles burning pain she has in her coccyx Diclofenac - helps the sharp stabbing pain like hot needles  She knows that the pain is worse when she is constipated so she has been trying to use fiber supplements because she is unable to eat enough fiber - she does not drink enough water during the day.  She currently has hemorrhoids that she is having trouble with getting them to heal. She has tried sitting in a hot bath but it is not helping much. She is also having symptoms of a yeast infection.   Patient Active Problem List   Diagnosis Date Noted  . Morbid obesity with BMI of 50.0-59.9, adult 04/01/2013  . 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 PCOS (polycystic ovarian syndrome) 07/13/2012    Current Outpatient Prescriptions on File Prior to Visit  Medication Sig Dispense Refill  . diclofenac (VOLTAREN) 75 MG EC tablet Take 1 tablet (75 mg total) by mouth 2 (two) times daily. 60 tablet 5  . gabapentin  (NEURONTIN) 600 MG tablet TAKE ONE TABLET BY MOUTH THREE TIMES DAILY.  "OV NEEDED" 270 tablet 0  . levonorgestrel (MIRENA) 20 MCG/24HR IUD 1 each by Intrauterine route once.    Marland Kitchen LORazepam (ATIVAN) 1 MG tablet Take 1 tablet (1 mg total) by mouth 2 (two) times daily as needed. for anxiety 30 tablet 0  . spironolactone (ALDACTONE) 25 MG tablet TAKE 2 TABLETS BY MOUTH DAILY..  "OV NEEDED FOR REFILLS" 60 tablet 0   No current facility-administered medications on file prior to visit.    Allergies  Allergen Reactions  . Iodine Rash    Review of Systems  Musculoskeletal: Positive for back pain.   Objective:  BP 112/78 mmHg  Pulse 101  Temp(Src) 97.7 F (36.5 C) (Oral)  Resp 18  Ht  (1.575 m)  Wt 296 lb (134.265 kg)  BMI 54.13 kg/m2  SpO2 98%  Physical Exam  Constitutional: She is oriented to person, place, and time and well-developed, well-nourished, and in no distress.  HENT:  Head: Normocephalic and atraumatic.  Right Ear: Hearing and external ear normal.  Left Ear: Hearing and external ear normal.  Eyes: Conjunctivae are normal.  Neck: Normal range of motion.  Pulmonary/Chest: Effort normal.  Neurological: She is alert and oriented to person, place, and time. Gait normal.  Skin: Skin is  warm and dry.  Psychiatric: Mood, memory, affect and judgment normal.  Vitals reviewed.  Results for orders placed or performed in visit on 06/11/15  POCT glucose (manual entry)  Result Value Ref Range   POC Glucose 152 (A) 70 - 99 mg/dl    Assessment and Plan :  Coccyxdynia - Plan: oxyCODONE-acetaminophen (ROXICET) 5-325 MG tablet, cyclobenzaprine (AMRIX) 15 MG 24 hr capsule -- we will add muscle relaxer in hopes that it will help her back pain and indirectly help her coccyxdynia.  I would like her to increase her gabapentin to tid and higher doses - she is unsure if she will be able to do this because she feels like it makes her groggy at work - she will at least increase her dose to   qhs but she would like to wait to see if the Amrix helps her pain.  If this does not help or she still needs more pain control we may consider adding Elavil to help with her chronic pain.  Morbid obesity with BMI of 50.0-59.9, adult  Hyperglycemia - Plan: COMPLETE METABOLIC PANEL WITH GFR, POCT glucose (manual entry)  NSAID long-term use - Plan: COMPLETE METABOLIC PANEL WITH GFR  Vaginal yeast infection - Plan: fluconazole (DIFLUCAN) 150 MG tablet  Hemorrhoids, unspecified hemorrhoid type - Plan: hydrocortisone (ANUSOL-HC) 2.5 % rectal cream - discussed with patient that soft stool is very important for her because we know that as her stool becomes harder her pain is increased - when her stools are hard this will also negatively affect her hemorrhoids.   Pt will let me know her medication progress by mychart in about 3-4 weeks  Benny Lennert PA-C  [facility] 06/11/2015 11:31 AM

## 2015-06-11 NOTE — Patient Instructions (Addendum)
Start Amrix at bedtime - this is a muscle relaxer that will hopefully help the pain indirectly by reducing muscle spasms  Try to increase the gabapentin to 3x/day dosing - if you cannot tolerate this - increase the bedtime dose to  and keep the other dose at   We may also add a medication called Elavil which is a medication that helps chronic pain - at some point - I do not want to add today because I want to try the muscle relaxer 1st because of the other pain you are having  Try to take a whole percocet to see if your pain is better controlled  F/u with neurologist schedule appt - to see if they can think of anything else

## 2015-06-13 ENCOUNTER — Encounter: Payer: Self-pay | Admitting: Physician Assistant

## 2015-06-17 NOTE — Telephone Encounter (Signed)
See note written to pt. Pharm was given # to call to get help from company processing the Rx with the coupon.

## 2015-06-21 ENCOUNTER — Encounter: Payer: Self-pay | Admitting: Physician Assistant

## 2015-06-26 ENCOUNTER — Other Ambulatory Visit: Payer: Self-pay | Admitting: Physician Assistant

## 2015-06-27 ENCOUNTER — Telehealth: Payer: Self-pay | Admitting: Physician Assistant

## 2015-06-27 NOTE — Telephone Encounter (Signed)
Will you review ? This looks like patient should have Rx already, was written for fill date 06/26/15

## 2015-06-27 NOTE — Telephone Encounter (Signed)
Refill Oxycodone.  [phone]

## 2015-06-28 NOTE — Telephone Encounter (Signed)
Remind pt that I gave her a Rx for her to fill 10/12 at her Sept visit

## 2015-07-11 ENCOUNTER — Encounter: Payer: Self-pay | Admitting: Physician Assistant

## 2015-07-11 DIAGNOSIS — M533 Sacrococcygeal disorders, not elsewhere classified: Secondary | ICD-10-CM

## 2015-07-11 MED ORDER — CYCLOBENZAPRINE HCL ER 15 MG PO CP24: 15.0000 mg | ORAL_CAPSULE | Freq: Every day | ORAL | Status: DC | PRN

## 2015-07-27 ENCOUNTER — Encounter: Payer: Self-pay | Admitting: Physician Assistant

## 2015-07-27 DIAGNOSIS — M533 Sacrococcygeal disorders, not elsewhere classified: Secondary | ICD-10-CM

## 2015-07-29 ENCOUNTER — Telehealth: Payer: Self-pay

## 2015-07-29 ENCOUNTER — Telehealth: Payer: Self-pay | Admitting: Physician Assistant

## 2015-07-29 NOTE — Telephone Encounter (Signed)
Pt had also called in Sunday during the EPIC outage for these refills. Oxy runs outs today, gabapentin should be changed from 600 to 300 and refill lorazepam.  [phone]

## 2015-07-29 NOTE — Telephone Encounter (Signed)
Please see previous phone exchanges about this patient's medication. She states that she is in severe pain and is very unhappy to be going another 24 hours without her medication. Please notify patient when her scripts are ready.   [phone]

## 2015-07-29 NOTE — Telephone Encounter (Signed)
Per [facility] policy controlled substances are to be refilled by the original prescriber.  Advise that these messages be sent to the original provider in the future.

## 2015-07-29 NOTE — Telephone Encounter (Signed)
Pt has already requested refills in her mychart to Weber on 07-27-15.  She called this morning because she hasn't heard anything.  Says she is completely out of her medicine and she doesn't care who fills them.  She needs refills on her Oxycodone, the 300 mg Gabapentin and Lorazepam.  [phone]

## 2015-07-30 MED ORDER — GABAPENTIN 300 MG PO CAPS: 300.0000 mg | ORAL_CAPSULE | Freq: Three times a day (TID) | ORAL | Status: DC

## 2015-07-30 MED ORDER — LORAZEPAM 1 MG PO TABS: 1.0000 mg | ORAL_TABLET | Freq: Two times a day (BID) | ORAL | Status: DC | PRN

## 2015-07-30 MED ORDER — GABAPENTIN 600 MG PO TABS: ORAL_TABLET | ORAL | Status: DC

## 2015-07-30 MED ORDER — OXYCODONE-ACETAMINOPHEN 5-325 MG PO TABS: 1.0000 | ORAL_TABLET | Freq: Three times a day (TID) | ORAL | Status: DC | PRN

## 2015-07-30 NOTE — Telephone Encounter (Signed)
Done - pt knows through my chart 

## 2015-07-30 NOTE — Telephone Encounter (Signed)
Pt calling about a refill on her oxycodone and Neurontin pt is crying and upset please respond

## 2015-07-30 NOTE — Telephone Encounter (Signed)
These have been done 

## 2015-07-30 NOTE — Addendum Note (Signed)
Addended by: Morrell RiddleWEBER, SARAH L on: 07/30/2015 12:38 PM   Modules accepted: Orders

## 2015-08-11 ENCOUNTER — Other Ambulatory Visit: Payer: Self-pay | Admitting: Physician Assistant

## 2015-08-28 ENCOUNTER — Telehealth: Payer: Self-pay

## 2015-08-28 DIAGNOSIS — M533 Sacrococcygeal disorders, not elsewhere classified: Secondary | ICD-10-CM

## 2015-08-28 NOTE — Telephone Encounter (Signed)
Refill on oxy   Best phone number [phone]

## 2015-08-29 NOTE — Telephone Encounter (Signed)
She would also like a refill on her LORazepam (ATIVAN) 1 MG tablet [[id number]][[id number]]

## 2015-08-30 MED ORDER — OXYCODONE-ACETAMINOPHEN 5-325 MG PO TABS: 1.0000 | ORAL_TABLET | Freq: Three times a day (TID) | ORAL | Status: DC | PRN

## 2015-08-30 MED ORDER — LORAZEPAM 1 MG PO TABS: 1.0000 mg | ORAL_TABLET | Freq: Two times a day (BID) | ORAL | Status: DC | PRN

## 2015-08-30 NOTE — Telephone Encounter (Signed)
Called pt and advised RX ready for pick up  

## 2015-08-30 NOTE — Telephone Encounter (Signed)
Done

## 2015-10-02 ENCOUNTER — Telehealth: Payer: Self-pay

## 2015-10-02 DIAGNOSIS — M533 Sacrococcygeal disorders, not elsewhere classified: Secondary | ICD-10-CM

## 2015-10-02 NOTE — Telephone Encounter (Signed)
Pt requesting refills for Oxy and Lorazapam  Best phone for pt is [phone]

## 2015-10-03 MED ORDER — LORAZEPAM 1 MG PO TABS: 1.0000 mg | ORAL_TABLET | Freq: Two times a day (BID) | ORAL | Status: DC | PRN

## 2015-10-03 MED ORDER — OXYCODONE-ACETAMINOPHEN 5-325 MG PO TABS
1.0000 | ORAL_TABLET | Freq: Three times a day (TID) | ORAL | Status: DC | PRN
Start: 2015-10-03 — End: 2015-10-31

## 2015-10-03 NOTE — Telephone Encounter (Signed)
Done ready for pick-up please let pt know it is time for an OV.

## 2015-10-03 NOTE — Telephone Encounter (Signed)
lmom that rx's are ready for pickup 

## 2015-10-31 ENCOUNTER — Telehealth: Payer: Self-pay

## 2015-10-31 ENCOUNTER — Ambulatory Visit (INDEPENDENT_AMBULATORY_CARE_PROVIDER_SITE_OTHER): Payer: 59 | Admitting: Physician Assistant

## 2015-10-31 VITALS — BP 138/86 | HR 78 | Temp 97.9°F | Resp 18 | Ht 63.78 in | Wt 297.0 lb

## 2015-10-31 DIAGNOSIS — Z6841 Body Mass Index (BMI) 40.0 and over, adult: Secondary | ICD-10-CM

## 2015-10-31 DIAGNOSIS — M7989 Other specified soft tissue disorders: Secondary | ICD-10-CM | POA: Diagnosis not present

## 2015-10-31 DIAGNOSIS — F430 Acute stress reaction: Secondary | ICD-10-CM | POA: Diagnosis not present

## 2015-10-31 DIAGNOSIS — F4323 Adjustment disorder with mixed anxiety and depressed mood: Secondary | ICD-10-CM | POA: Insufficient documentation

## 2015-10-31 DIAGNOSIS — E559 Vitamin D deficiency, unspecified: Secondary | ICD-10-CM

## 2015-10-31 DIAGNOSIS — M533 Sacrococcygeal disorders, not elsewhere classified: Secondary | ICD-10-CM | POA: Diagnosis not present

## 2015-10-31 DIAGNOSIS — E7800 Pure hypercholesterolemia, unspecified: Secondary | ICD-10-CM

## 2015-10-31 LAB — LIPID PANEL
CHOLESTEROL: 247 mg/dL — AB (ref 125–200)
HDL: 61 mg/dL (ref >=46)
LDL Cholesterol: 162 mg/dL — ABNORMAL HIGH (ref <130)
TRIGLYCERIDES: 119 mg/dL (ref <150)
Total CHOL/HDL Ratio: 4 Ratio (ref <=5.0)
VLDL: 24 mg/dL (ref <30)

## 2015-10-31 LAB — COMPLETE METABOLIC PANEL WITH GFR
ALBUMIN: 4.3 g/dL (ref 3.6–5.1)
ALK PHOS: 79 U/L (ref 33–115)
ALT: 18 U/L (ref 6–29)
AST: 21 U/L (ref 10–35)
BILIRUBIN TOTAL: 0.4 mg/dL (ref 0.2–1.2)
BUN: 13 mg/dL (ref 7–25)
CALCIUM: 9.7 mg/dL (ref 8.6–10.2)
CO2: 21 mmol/L (ref 20–31)
CREATININE: 0.68 mg/dL (ref 0.50–1.10)
Chloride: 99 mmol/L (ref 98–110)
GFR, Est African American: >89 mL/min (ref >=60)
GFR, Est Non African American: >89 mL/min (ref >=60)
Glucose, Bld: 104 mg/dL — ABNORMAL HIGH (ref 65–99)
Potassium: 4.2 mmol/L (ref 3.5–5.3)
Sodium: 135 mmol/L (ref 135–146)
TOTAL PROTEIN: 7.5 g/dL (ref 6.1–8.1)

## 2015-10-31 LAB — VITAMIN D 25 HYDROXY (VIT D DEFICIENCY, FRACTURES): Vit D, 25-Hydroxy: 16 ng/mL — ABNORMAL LOW (ref 30–100)

## 2015-10-31 LAB — SEDIMENTATION RATE: SED RATE: 51 mm/h — AB (ref 0–20)

## 2015-10-31 MED ORDER — SPIRONOLACTONE 25 MG PO TABS: ORAL_TABLET | ORAL | Status: DC

## 2015-10-31 MED ORDER — LORAZEPAM 1 MG PO TABS: 1.0000 mg | ORAL_TABLET | Freq: Two times a day (BID) | ORAL | Status: DC | PRN

## 2015-10-31 MED ORDER — OXYCODONE-ACETAMINOPHEN 5-325 MG PO TABS: 1.0000 | ORAL_TABLET | Freq: Three times a day (TID) | ORAL | Status: DC | PRN

## 2015-10-31 MED ORDER — DULOXETINE HCL 20 MG PO CPEP: ORAL_CAPSULE | ORAL | Status: DC

## 2015-10-31 NOTE — Telephone Encounter (Signed)
DULoxetine (CYMBALTA) 20 MG capsule  Did not pick up.   "NO GO"  Insurance will not cover it will be OOP at $165.00  Benny Lennert    [phone]

## 2015-10-31 NOTE — Progress Notes (Signed)
Jeanne Choi  MRN: [id number] DOB: Sep 05, 1971  Subjective:  Pt presents to clinic for a recheck and medication refills.  She is doing ok.  She is taking her medications daily.    Gabapentin  bid - she has started taking a 3rd dose sometimes esp around her menses - she finds that her pain is worse that week of the month and when she takes an addition gabapentin she does not have to increase her pain pills to 3 pills a day.  The only problem is that she has less focus and brain clearness on the 3 a day. Oxycodone - Takes 1/2 tablet of pain pill at a time but a total of 2 pills a day Spironolactone - ran out but since then she has had more swelling in her legs.  She sits at work and the swelling gets worse - the swelling is slightly better after she has slept with her feet elevated.   Ativan - takes is daily for her stress and irritability   Amrix did not make a difference so she stopped it.  She would like to have gastric bypass but her insurance will not cover it. Her insurance does not cover PT - she would like to do it but cannot afford it  Less nicotine - 0.6 now with the vaporizer - she is excited and proud of that  She would like to see someone Jeanne for a diagnosis but she is not sure she wants to go back to [facility] to have someone Jeanne tell her there is nothing Jeanne to do.  She knows she should have an MRI but it is going to be really expensive.  She is having some trouble with urinary dribbling after urination when she stands up since she has gained all this weight - she has become very self conscious of that -   Patient Active Problem List   Diagnosis Date Noted  . Stress reaction 10/31/2015  . Morbid obesity with BMI of 50.0-59.9, adult (HCC) 04/01/2013  . 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 PCOS (polycystic ovarian syndrome) 07/13/2012    Current Outpatient Prescriptions on File Prior to Visit  Medication Sig Dispense Refill  . gabapentin (NEURONTIN) 600 MG tablet TAKE ONE  TABLET BY MOUTH THREE TIMES DAILY.  "OV NEEDED" 270 tablet 0  . levonorgestrel (MIRENA) 20 MCG/24HR IUD 1 each by Intrauterine route once.     No current facility-administered medications on file prior to visit.    Allergies  Allergen Reactions  . Iodine Rash    Review of Systems  Constitutional: Negative for fever and chills.  Musculoskeletal: Positive for back pain (no change from past).  Psychiatric/Behavioral: The patient is nervous/anxious (stress).    Objective:  BP 138/86 mmHg  Pulse 78  Temp(Src) 97.9 F (36.6 C) (Oral)  Resp 18  Ht 5' 3.78" (1.62 m)  Wt 297 lb (134.718 kg)  BMI 51.33 kg/m2  SpO2 98%  Physical Exam  Constitutional: She is oriented to person, place, and time and well-developed, well-nourished, and in no distress.  HENT:  Head: Normocephalic and atraumatic.  Right Ear: Hearing and external ear normal.  Left Ear: Hearing and external ear normal.  Eyes: Conjunctivae are normal.  Neck: Normal range of motion.  Cardiovascular: Normal rate, regular rhythm and normal heart sounds.   No murmur heard. Pulmonary/Chest: Effort normal and breath sounds normal. She has no wheezes.  Neurological: She is alert and oriented toWendelyn Choi, and time. Gait normal.  Skin: Skin is warm  and dry.  Psychiatric: Mood, memory, affect and judgment normal.  Vitals reviewed.   Assessment and Plan :  Elevated cholesterol - Plan: Lipid panel, Care order/instruction - check labs  Coccyxdynia - Plan: Sedimentation rate, DULoxetine (CYMBALTA) 20 MG capsule, oxyCODONE-acetaminophen (ROXICET) 5-325 MG tablet - we are going to try to add Cymbalta in hopes to help her stress level and pain levels and also help some depressive symptoms that she is starting to have - pt to call in a a month to see how she is tolerating the cymbalta we are going to push the dose of that to see if we can decrease the need for her pain medications and ativan.  She finds that her pain is better on  gabapentin tid but she is not able to function as well at work and at home  Morbid obesity with BMI of 50.0-59.9, adult (HCC) - Plan: COMPLETE METABOLIC PANEL WITH GFR - pt would be a great candidate for bariatric surgery and she is interested but her insurance does not cover it - encouraged weight loss through diet changes and any type of exercise that is good for her weight such as swimming would be helpful  Stress reaction - Plan: DULoxetine (CYMBALTA) 20 MG capsule, LORazepam (ATIVAN) 1 MG tablet  Vitamin D deficiency - Plan: VITAMIN D 25 Hydroxy (Vit-D Deficiency, Fractures) - check labs - pt has been low before and this could be adding her to pain  Leg swelling - Plan: spironolactone (ALDACTONE) 25 MG tablet - restart - pt should also start wearing medium compression socks  Benny Lennert PA-C  [facility] 10/31/2015 9:53 AM

## 2015-10-31 NOTE — Patient Instructions (Addendum)
CEP brand socks - that is what I wear You need at least medium compression- 25-30mg   [email or web address] -

## 2015-11-04 NOTE — Telephone Encounter (Signed)
Noted  

## 2015-11-07 ENCOUNTER — Encounter: Payer: Self-pay | Admitting: Physician Assistant

## 2015-11-07 MED ORDER — VITAMIN D (ERGOCALCIFEROL) 1.25 MG (50000 UNIT) PO CAPS: 50000.0000 [IU] | ORAL_CAPSULE | ORAL | Status: DC

## 2015-11-07 NOTE — Addendum Note (Signed)
Addended by: Morrell Riddle on: 11/07/2015 08:29 AM   Modules accepted: Orders

## 2015-11-22 ENCOUNTER — Telehealth: Payer: Self-pay

## 2015-11-22 DIAGNOSIS — M533 Sacrococcygeal disorders, not elsewhere classified: Secondary | ICD-10-CM

## 2015-11-22 NOTE — Telephone Encounter (Signed)
Pt is needing a refill on her oxycodone and she has about one week of gabapentin and she still has not heard from her insurance company -she wanted sarah to know   Best number [phone]

## 2015-11-23 MED ORDER — OXYCODONE-ACETAMINOPHEN 5-325 MG PO TABS: 1.0000 | ORAL_TABLET | Freq: Three times a day (TID) | ORAL | Status: DC | PRN

## 2015-11-23 MED ORDER — GABAPENTIN 600 MG PO TABS: ORAL_TABLET | ORAL | Status: DC

## 2015-11-23 NOTE — Telephone Encounter (Signed)
Done

## 2015-11-23 NOTE — Telephone Encounter (Signed)
Pt notified.  Ready for pickup at 102

## 2015-11-28 ENCOUNTER — Telehealth: Payer: Self-pay

## 2015-11-28 DIAGNOSIS — F430 Acute stress reaction: Secondary | ICD-10-CM

## 2015-11-28 MED ORDER — LORAZEPAM 1 MG PO TABS: 1.0000 mg | ORAL_TABLET | Freq: Two times a day (BID) | ORAL | Status: DC | PRN

## 2015-11-28 NOTE — Telephone Encounter (Signed)
Done

## 2015-11-28 NOTE — Telephone Encounter (Signed)
Pt is needing her ativan refilled   Best number [phone]

## 2015-11-29 NOTE — Telephone Encounter (Signed)
Faxed and notified pt on VM. 

## 2015-12-01 ENCOUNTER — Other Ambulatory Visit: Payer: Self-pay | Admitting: Physician Assistant

## 2015-12-16 ENCOUNTER — Telehealth: Payer: Self-pay

## 2015-12-16 NOTE — Telephone Encounter (Signed)
Pt requesting oxychodone and Lorazapam    Best phone for pt is [phone]

## 2015-12-17 NOTE — Telephone Encounter (Signed)
Her medication is not due until next week

## 2015-12-18 ENCOUNTER — Encounter: Payer: Self-pay | Admitting: Physician Assistant

## 2015-12-18 ENCOUNTER — Telehealth: Payer: Self-pay

## 2015-12-18 DIAGNOSIS — F430 Acute stress reaction: Secondary | ICD-10-CM

## 2015-12-18 DIAGNOSIS — M533 Sacrococcygeal disorders, not elsewhere classified: Secondary | ICD-10-CM

## 2015-12-18 NOTE — Telephone Encounter (Signed)
Pt  Requesting Oxychodone refill,she is aware that it is not due to be refilled until the 11th, But states she had a really bad week and used more   Best phone for pt is [phone]

## 2015-12-18 NOTE — Telephone Encounter (Signed)
LM to call back next week.

## 2015-12-19 MED ORDER — LORAZEPAM 1 MG PO TABS: 1.0000 mg | ORAL_TABLET | Freq: Two times a day (BID) | ORAL | Status: DC | PRN

## 2015-12-19 MED ORDER — OXYCODONE-ACETAMINOPHEN 5-325 MG PO TABS: 1.0000 | ORAL_TABLET | Freq: Three times a day (TID) | ORAL | Status: DC | PRN

## 2015-12-19 NOTE — Telephone Encounter (Signed)
Done - pt knows through my chart 

## 2015-12-19 NOTE — Telephone Encounter (Signed)
This is duplicate from email, also routed to you, [name].

## 2016-01-12 ENCOUNTER — Other Ambulatory Visit: Payer: Self-pay | Admitting: Physician Assistant

## 2016-01-12 ENCOUNTER — Telehealth: Payer: Self-pay

## 2016-01-12 DIAGNOSIS — M533 Sacrococcygeal disorders, not elsewhere classified: Secondary | ICD-10-CM

## 2016-01-12 NOTE — Telephone Encounter (Signed)
Requesting refill  oxyCODONE-acetaminophen (ROXICET) 5-325 MG tablet   Will be out Tuesday night.   Taking at needed.   [phone]

## 2016-01-14 MED ORDER — OXYCODONE-ACETAMINOPHEN 5-325 MG PO TABS: 1.0000 | ORAL_TABLET | Freq: Three times a day (TID) | ORAL | Status: DC | PRN

## 2016-01-14 NOTE — Telephone Encounter (Signed)
Notified pt ready on VM. 

## 2016-01-14 NOTE — Telephone Encounter (Signed)
Done

## 2016-01-26 ENCOUNTER — Encounter: Payer: Self-pay | Admitting: Physician Assistant

## 2016-01-26 ENCOUNTER — Other Ambulatory Visit: Payer: Self-pay | Admitting: Physician Assistant

## 2016-01-27 ENCOUNTER — Telehealth: Payer: Self-pay

## 2016-01-27 NOTE — Telephone Encounter (Signed)
Pt is needing a refill on ativan   Best number [phone]

## 2016-01-28 MED ORDER — METFORMIN HCL 500 MG PO TABS: 500.0000 mg | ORAL_TABLET | Freq: Two times a day (BID) | ORAL | Status: DC

## 2016-01-28 NOTE — Telephone Encounter (Signed)
Faxed and notified pt on Vm. 

## 2016-01-28 NOTE — Telephone Encounter (Signed)
Done

## 2016-02-13 ENCOUNTER — Telehealth: Payer: Self-pay

## 2016-02-13 NOTE — Telephone Encounter (Signed)
Pt is requesting a medication refill on the oxyCODONE-acetaminophen (ROXICET) 5-325 MG tablet [[id number]][[id number]] she will be completely out of the medication on Saturday 6/3. Please call patient to inform that refill is ready to be picked up.

## 2016-02-14 NOTE — Telephone Encounter (Signed)
LMVM stating that patient's RX is ready for pickup.

## 2016-02-15 ENCOUNTER — Other Ambulatory Visit: Payer: Self-pay | Admitting: Physician Assistant

## 2016-02-15 DIAGNOSIS — M533 Sacrococcygeal disorders, not elsewhere classified: Secondary | ICD-10-CM

## 2016-02-15 MED ORDER — OXYCODONE-ACETAMINOPHEN 5-325 MG PO TABS: 1.0000 | ORAL_TABLET | Freq: Three times a day (TID) | ORAL | Status: DC | PRN

## 2016-02-15 NOTE — Telephone Encounter (Signed)
Rx is picked up.- [facility]. Pt is asking that Roxicet 5-325 is post dated for a 2 mo. R/x- please contact to discuss if this is an option  -[phone] please call

## 2016-02-17 NOTE — Telephone Encounter (Signed)
This is not something that I do unless there are extenuating circumstances

## 2016-02-19 ENCOUNTER — Encounter: Payer: Self-pay | Admitting: Physician Assistant

## 2016-02-19 DIAGNOSIS — M533 Sacrococcygeal disorders, not elsewhere classified: Secondary | ICD-10-CM

## 2016-02-19 NOTE — Telephone Encounter (Signed)
LMOM for pt with Sarah's message. 

## 2016-02-20 MED ORDER — OXYCODONE-ACETAMINOPHEN 5-325 MG PO TABS: 1.0000 | ORAL_TABLET | Freq: Three times a day (TID) | ORAL | Status: DC | PRN

## 2016-02-20 NOTE — Telephone Encounter (Signed)
Rx in drawer. 

## 2016-02-20 NOTE — Telephone Encounter (Signed)
Ready to pick up - pt knows through mychart 

## 2016-03-23 ENCOUNTER — Other Ambulatory Visit: Payer: Self-pay | Admitting: Physician Assistant

## 2016-03-23 ENCOUNTER — Telehealth: Payer: Self-pay

## 2016-03-23 DIAGNOSIS — M533 Sacrococcygeal disorders, not elsewhere classified: Secondary | ICD-10-CM

## 2016-03-23 MED ORDER — OXYCODONE-ACETAMINOPHEN 5-325 MG PO TABS: 1.0000 | ORAL_TABLET | Freq: Three times a day (TID) | ORAL | Status: DC | PRN

## 2016-03-23 NOTE — Telephone Encounter (Signed)
Meds ordered this encounter  Medications  . oxyCODONE-acetaminophen (ROXICET) 5-325 MG tablet    Sig: Take 1 tablet by mouth every 8 (eight) hours as needed.    Dispense:  60 tablet    Refill:  0    Order Specific Question:  Supervising Provider    Answer:  Clelia CroftSHAW, EVA N [[id number]]

## 2016-03-23 NOTE — Telephone Encounter (Signed)
Pt is needing a refill on her pain medication and she is completely out   Best number [phone]

## 2016-03-23 NOTE — Telephone Encounter (Signed)
Oxycodone refill

## 2016-03-24 NOTE — Telephone Encounter (Signed)
Notified pt ready. 

## 2016-03-24 NOTE — Telephone Encounter (Signed)
Patient is out of pain medication.   [phone]

## 2016-04-06 ENCOUNTER — Other Ambulatory Visit: Payer: Self-pay | Admitting: Physician Assistant

## 2016-04-08 NOTE — Telephone Encounter (Signed)
I have refilled - the patient will need an OV to discuss her medications further

## 2016-04-08 NOTE — Telephone Encounter (Signed)
LMOM that RF was sent and need for f/up for more.

## 2016-04-11 ENCOUNTER — Other Ambulatory Visit: Payer: Self-pay | Admitting: Physician Assistant

## 2016-04-11 DIAGNOSIS — M533 Sacrococcygeal disorders, not elsewhere classified: Secondary | ICD-10-CM

## 2016-04-13 NOTE — Telephone Encounter (Addendum)
Pt called to check on this RF stating that the pharm said they faxed it over to [place] a week ago. We have not received any requests for it either through Surescripts or by fax until this one on the 29th. Sarah just RFd her lorazepam for 1 mos stating that pt will need to RTC for more. I will give pt 1 mos of gabapentin as well. Called and LMOM to notify pt and reminded her that Maralyn Sago wants to see her w/in the month for f/up before more RFs area needed.

## 2016-04-18 ENCOUNTER — Encounter: Payer: Self-pay | Admitting: Physician Assistant

## 2016-04-18 ENCOUNTER — Ambulatory Visit (INDEPENDENT_AMBULATORY_CARE_PROVIDER_SITE_OTHER): Payer: 59 | Admitting: Osteopathic Medicine

## 2016-04-18 VITALS — BP 122/78 | HR 88 | Temp 98.1°F | Resp 16 | Ht 63.0 in | Wt 271.0 lb

## 2016-04-18 DIAGNOSIS — M533 Sacrococcygeal disorders, not elsewhere classified: Secondary | ICD-10-CM | POA: Diagnosis not present

## 2016-04-18 MED ORDER — OXYCODONE-ACETAMINOPHEN 5-325 MG PO TABS: 1.0000 | ORAL_TABLET | Freq: Three times a day (TID) | ORAL | 0 refills | Status: DC | PRN

## 2016-04-18 NOTE — Progress Notes (Signed)
HPI: Jeanne Choi is a 45 y.o. Not Hispanic or Latino female  who presents to [facility] today, 04/18/16,  for chief complaint of:  Chief Complaint  Patient presents with  . Medication Refill    oxycodone/ pt states she is in pain     Patient here for medication refill. Requests oxycodone. Typically she gets this from Benny Lennert. Last dispensation per electronic controlled substance database review was 03/24/2016, 60 pills for 20 day supply. Patient has been getting this medication monthly, fairly consistency over the past review 6 months   Past medical, surgical, social and family history reviewed: Past Medical History:  Diagnosis Date  . Allergy   . Diabetes mellitus without complication (HCC)   . Neuromuscular disorder [facility])    Past Surgical History:  Procedure Laterality Date  . CHOLECYSTECTOMY    . PILONIDAL CYST / SINUS EXCISION     Social History  Substance Use Topics  . Smoking status: Former Smoker    Quit date: 03/23/2010  . Smokeless tobacco: Not on file     Comment: patient uses vape  . Alcohol use 1.2 oz/week    2 Glasses of wine per week   Family History  Problem Relation Age of Onset  . Hypertension Mother   . Heart disease Mother   . Heart disease Maternal Grandfather   . Diabetes Neg Hx      Current medication list and allergy/intolerance information reviewed:   Current Outpatient Prescriptions  Medication Sig Dispense Refill  . gabapentin (NEURONTIN) 600 MG tablet TAKE 1 TABLET BY MOUTH THREE TIMES DAILY 90 tablet 0  . levonorgestrel (MIRENA) 20 MCG/24HR IUD 1 each by Intrauterine route once.    Marland Kitchen LORazepam (ATIVAN) 1 MG tablet TAKE 1 TABLET BY MOUTH TWICE DAILY AS NEEDED FOR ANXIETY 30 tablet 0  . metFORMIN (GLUCOPHAGE) 500 MG tablet TAKE 1 TABLET(500 MG) BY MOUTH TWICE DAILY WITH A MEAL 180 tablet 0  . oxyCODONE-acetaminophen (ROXICET) 5-325 MG tablet Take 1 tablet by mouth every 8 (eight) hours as needed. 60 tablet  0  . spironolactone (ALDACTONE) 25 MG tablet TAKE 2 TABLETS BY MOUTH DAILY 180 tablet 1  . Vitamin D, Ergocalciferol, (DRISDOL) 50000 units CAPS capsule Take 1 capsule (50,000 Units total) by mouth every 7 (seven) days. 12 capsule 0  . DULoxetine (CYMBALTA) 20 MG capsule 1 po qd and then increase to 2 po qd in 2 weeks (Patient not taking: Reported on 04/18/2016) 60 capsule 0   No current facility-administered medications for this visit.    Allergies  Allergen Reactions  . Iodine Rash      Review of Systems:  Constitutional:  No  fever, no chills, (+) intentional weight loss  Cardiac: No  chest pain, No  pressure  Musculoskeletal: No new myalgia/arthralgia  Exam:  BP 122/78   Pulse 88   Temp 98.1 F (36.7 C) (Oral)   Resp 16   Ht 5\' 3"  (1.6 m)   Wt 271 lb (122.9 kg)   LMP  (LMP Unknown)   SpO2 98%   BMI 48.01 kg/m   Constitutional: VS see above. General Appearance: alert, well-developed, well-nourished, NAD  Respiratory: Normal respiratory effort. Musculoskeletal: Gait normal. No clubbing/cyanosis of digits.   Psychiatric: Normal judgment/insight. Normal mood and affect. Oriented x3.     ASSESSMENT/PLAN:   Refill was given, same as [name] had been prescribing. Had a brief discussion with the patient regarding upcoming changes in controlled substance policy for the  [place] system and in [place] law. Patient may be asked to come back to schedule an appointment for her next refill, may be asked to participate in controlled substance contract. Patient verbalizes understanding of the plan. Prescription written with instructions that refill requests be routed to Benny Lennert.  Coccyxdynia - Plan: oxyCODONE-acetaminophen (ROXICET) 5-325 MG tablet     Visit summary with medication list and pertinent instructions was printed for patient to review. All questions at time of visit were answered - patient instructed to contact office with any additional concerns. ER/RTC precautions  were reviewed with the patient. Follow-up plan: Return in about 4 weeks (around 05/16/2016), or sooner if needed , for MEDICATION MANAGEMENT with PCP for Controlled Substance .   Total time spent: 15 minutes, greater than 50% of the visit was spent face-to-face counseling patient for above diagnosis of coccydynia/medication management.

## 2016-04-18 NOTE — Patient Instructions (Signed)
     IF you received an x-ray today, you will receive an invoice from [facility]. Please contact [facility] at [phone] with questions or concerns regarding your invoice.   IF you received labwork today, you will receive an invoice from [facility]. Please contact [facility] at [phone] with questions or concerns regarding your invoice.   Our billing staff will not be able to assist you with questions regarding bills from these companies.  You will be contacted with the lab results as soon as they are available. The fastest way to get your results is to activate your My Chart account. Instructions are located on the last page of this paperwork. If you have not heard from us regarding the results in 2 weeks, please contact this office.      

## 2016-04-28 ENCOUNTER — Ambulatory Visit (INDEPENDENT_AMBULATORY_CARE_PROVIDER_SITE_OTHER): Payer: 59 | Admitting: Physician Assistant

## 2016-04-28 ENCOUNTER — Encounter: Payer: Self-pay | Admitting: Physician Assistant

## 2016-04-28 VITALS — BP 126/88 | HR 86 | Temp 98.4°F | Resp 18 | Ht 63.0 in | Wt 269.0 lb

## 2016-04-28 DIAGNOSIS — F4323 Adjustment disorder with mixed anxiety and depressed mood: Secondary | ICD-10-CM

## 2016-04-28 DIAGNOSIS — S93401A Sprain of unspecified ligament of right ankle, initial encounter: Secondary | ICD-10-CM

## 2016-04-28 DIAGNOSIS — G894 Chronic pain syndrome: Secondary | ICD-10-CM

## 2016-04-28 DIAGNOSIS — M533 Sacrococcygeal disorders, not elsewhere classified: Secondary | ICD-10-CM | POA: Diagnosis not present

## 2016-04-28 DIAGNOSIS — Z6841 Body Mass Index (BMI) 40.0 and over, adult: Secondary | ICD-10-CM | POA: Diagnosis not present

## 2016-04-28 MED ORDER — LIDOCAINE 5 % EX PTCH: 1.0000 | MEDICATED_PATCH | CUTANEOUS | 0 refills | Status: DC

## 2016-04-28 MED ORDER — GABAPENTIN 300 MG PO CAPS: 600.0000 mg | ORAL_CAPSULE | Freq: Three times a day (TID) | ORAL | 0 refills | Status: DC

## 2016-04-28 NOTE — Patient Instructions (Addendum)
For therapy -- [facility] ([address], Ernest McCoy, Heather Joycelyn SchmidKitchens, Karla [place]) - [phone] [facility](Julie FenwickWhitt) - [phone] [facility] - [phone] [facility] - [phone] [facility]  - [phone] (do not file insurance)   [facility] -   IF you received an x-ray today, you will receive an [facility] from [facility]. Please contact [facility] at [phone] with questions or concerns regarding your invoice.   IF you received labwork today, you will receive an invoice from [facility]. Please contact [facility] at [phone] with questions or concerns regarding your invoice.   Our billing staff will not be able to assist you with questions regarding bills from these companies.  You will be contacted with the lab results as soon as they are available. The fastest way to get your results is to activate your My Chart account. Instructions are located on the last page of this paperwork. If you have not heard from us regarding the results in 2 weeks, please contact this office.
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
## 2016-05-11 ENCOUNTER — Encounter: Payer: Self-pay | Admitting: Physician Assistant

## 2016-05-11 DIAGNOSIS — M533 Sacrococcygeal disorders, not elsewhere classified: Secondary | ICD-10-CM

## 2016-05-12 MED ORDER — OXYCODONE-ACETAMINOPHEN 5-325 MG PO TABS: 1.0000 | ORAL_TABLET | Freq: Three times a day (TID) | ORAL | 0 refills | Status: DC | PRN

## 2016-05-12 NOTE — Telephone Encounter (Signed)
Done - pt knows through my chart 

## 2016-05-13 ENCOUNTER — Telehealth: Payer: Self-pay

## 2016-05-13 NOTE — Telephone Encounter (Signed)
LMOVM = RX at front desk ready for pick up

## 2016-05-27 ENCOUNTER — Encounter: Payer: Self-pay | Admitting: Physician Assistant

## 2016-06-17 ENCOUNTER — Telehealth: Payer: Self-pay

## 2016-06-17 ENCOUNTER — Encounter: Payer: Self-pay | Admitting: Physician Assistant

## 2016-06-17 DIAGNOSIS — M533 Sacrococcygeal disorders, not elsewhere classified: Secondary | ICD-10-CM

## 2016-06-17 MED ORDER — OXYCODONE-ACETAMINOPHEN 5-325 MG PO TABS: 1.0000 | ORAL_TABLET | Freq: Three times a day (TID) | ORAL | 0 refills | Status: DC | PRN

## 2016-06-17 NOTE — Telephone Encounter (Signed)
Currently at [facility], they are discussing injections with pt and she would like to consult with you first. States that it is time sensitive as she is currently at apt. Phones have to be silenced, so please text or email. She will send an email with more details.   Thank you!

## 2016-06-17 NOTE — Telephone Encounter (Signed)
Called and talked patient - [facility] wanted to do injections but she has had those before and they did not help. She is not planning on returning to their office.  She wants to look into PT - she will plan on calling her insurance to see about her coverage. They will not cover lidoderm patch.  She is almost out of her Oxycodone and needs a refill.  She is still exercising and trying to eat better.

## 2016-06-17 NOTE — Telephone Encounter (Signed)
Pt knows about mediation is ready to pick up

## 2016-07-16 ENCOUNTER — Telehealth: Payer: Self-pay

## 2016-07-16 DIAGNOSIS — M533 Sacrococcygeal disorders, not elsewhere classified: Secondary | ICD-10-CM

## 2016-07-16 NOTE — Telephone Encounter (Signed)
Please advise 

## 2016-07-16 NOTE — Telephone Encounter (Signed)
PATIENT WOULD LIKE TO [facility] KNOW THAT IT IS TIME FOR HER PRESCRIPTION FOR OXYCODONE 5-325 MG. PLEASE CALL HER WHEN SHE CAN PICK IT UP. BEST PHONE [phone] (CELL - PLEASE LEAVE A MESSAGE) MBC

## 2016-07-17 ENCOUNTER — Encounter: Payer: Self-pay | Admitting: Physician Assistant

## 2016-07-17 DIAGNOSIS — M533 Sacrococcygeal disorders, not elsewhere classified: Secondary | ICD-10-CM

## 2016-07-20 NOTE — Telephone Encounter (Signed)
I will do Tuesday am

## 2016-07-20 NOTE — Telephone Encounter (Signed)
Pended both Rxs for review.

## 2016-07-21 ENCOUNTER — Other Ambulatory Visit: Payer: Self-pay | Admitting: Physician Assistant

## 2016-07-21 MED ORDER — OXYCODONE-ACETAMINOPHEN 5-325 MG PO TABS: 1.0000 | ORAL_TABLET | Freq: Three times a day (TID) | ORAL | 0 refills | Status: DC | PRN

## 2016-07-21 NOTE — Telephone Encounter (Signed)
Done

## 2016-07-23 MED ORDER — METFORMIN HCL 500 MG PO TABS: ORAL_TABLET | ORAL | 0 refills | Status: DC

## 2016-08-18 ENCOUNTER — Other Ambulatory Visit: Payer: Self-pay

## 2016-08-18 DIAGNOSIS — M533 Sacrococcygeal disorders, not elsewhere classified: Secondary | ICD-10-CM

## 2016-08-18 MED ORDER — OXYCODONE-ACETAMINOPHEN 5-325 MG PO TABS: 1.0000 | ORAL_TABLET | Freq: Three times a day (TID) | ORAL | 0 refills | Status: DC | PRN

## 2016-08-18 NOTE — Addendum Note (Signed)
Addended by: Morrell RiddleWEBER, Jaedyn Lard L on: 08/18/2016 01:56 PM   Modules accepted: Orders

## 2016-08-18 NOTE — Telephone Encounter (Addendum)
Done

## 2016-08-18 NOTE — Telephone Encounter (Signed)
Last seen 04/2016 Last refill 07/21/16

## 2016-08-18 NOTE — Telephone Encounter (Signed)
Pt is calling in regards to needing a refill on her oxycodone    Please advise [phone]

## 2016-08-18 NOTE — Addendum Note (Signed)
Addended by: Clarene CritchleyKOLLER, Hadiyah Maricle M on: 08/18/2016 09:47 AM   Modules accepted: Orders

## 2016-08-20 NOTE — Telephone Encounter (Signed)
Rx in box and pt notified ready on Vm.

## 2016-09-05 ENCOUNTER — Other Ambulatory Visit: Payer: Self-pay | Admitting: Physician Assistant

## 2016-09-05 ENCOUNTER — Other Ambulatory Visit: Payer: Self-pay | Admitting: *Deleted

## 2016-09-05 ENCOUNTER — Other Ambulatory Visit: Payer: Self-pay

## 2016-09-05 DIAGNOSIS — M533 Sacrococcygeal disorders, not elsewhere classified: Secondary | ICD-10-CM

## 2016-09-05 MED ORDER — GABAPENTIN 300 MG PO CAPS: 600.0000 mg | ORAL_CAPSULE | Freq: Three times a day (TID) | ORAL | 0 refills | Status: DC

## 2016-09-05 NOTE — Telephone Encounter (Signed)
Patient is calling to request a refill of her Gabapentin medication.  She put in a request through her pharmacy at the beginning of the week and she called the pharmacy to check on the progress and they said they never sent out a request.  Patient is currently out of medication.  [facility] [address]. Please advise!  [phone]

## 2016-09-08 MED ORDER — GABAPENTIN 300 MG PO CAPS: 600.0000 mg | ORAL_CAPSULE | Freq: Three times a day (TID) | ORAL | 0 refills | Status: DC

## 2016-09-08 NOTE — Telephone Encounter (Signed)
04/2016 last ov 10/2015 last labs

## 2016-09-18 ENCOUNTER — Other Ambulatory Visit: Payer: Self-pay

## 2016-09-18 DIAGNOSIS — M533 Sacrococcygeal disorders, not elsewhere classified: Secondary | ICD-10-CM

## 2016-09-18 NOTE — Telephone Encounter (Signed)
Pt would like a refill on her oxyCODONE-acetaminophen (ROXICET) 5-325 MG tablet [[id number]. She is not sure if she is suppose to come in to be seen or if she is due for another refill. Please advise at [phone]

## 2016-09-19 NOTE — Telephone Encounter (Signed)
08/18/16 last refill 04/2016 last ov

## 2016-09-19 NOTE — Telephone Encounter (Signed)
Pt called in to get refill on Roxicet Pt was due for appt with Dr. Pernell DupreAdams at [facility] 06/17/2016 - pt states she is NOT seeing Dr. Pernell DupreAdams and is only going through [name].  Please Advise

## 2016-09-22 ENCOUNTER — Ambulatory Visit (INDEPENDENT_AMBULATORY_CARE_PROVIDER_SITE_OTHER): Payer: 59 | Admitting: Physician Assistant

## 2016-09-22 DIAGNOSIS — M533 Sacrococcygeal disorders, not elsewhere classified: Secondary | ICD-10-CM

## 2016-09-22 MED ORDER — OXYCODONE-ACETAMINOPHEN 5-325 MG PO TABS: 1.0000 | ORAL_TABLET | Freq: Three times a day (TID) | ORAL | 0 refills | Status: DC | PRN

## 2016-09-22 NOTE — Patient Instructions (Signed)
     IF you received an x-ray today, you will receive an invoice from [facility]. Please contact [facility] at [phone] with questions or concerns regarding your invoice.   IF you received labwork today, you will receive an invoice from [facility]. Please contact [facility] at [phone] with questions or concerns regarding your invoice.   Our billing staff will not be able to assist you with questions regarding bills from these companies.  You will be contacted with the lab results as soon as they are available. The fastest way to get your results is to activate your My Chart account. Instructions are located on the last page of this paperwork. If you have not heard from us regarding the results in 2 weeks, please contact this office.     

## 2016-09-22 NOTE — Progress Notes (Signed)
   Jeanne OhmsKristine Choi  MRN: [id number] DOB: 10/07/1970  Subjective:  Pt presents to clinic for medication refill.  She is doing ok  - she has lost 60 lbs in the last year - she is doing keto and is very proud of herself.  She feels good - her foot pain is almost gone but her chronic pain is not much improved - she went to pain management and they were unable to offer her much in the pain of other pain modalities.  Review of Systems  Patient Active Problem List   Diagnosis Date Noted  . Chronic pain syndrome 04/28/2016  . Adjustment reaction with anxiety and depression 10/31/2015  . Morbid obesity with BMI of 50.0-59.9, adult (HCC) 04/01/2013  . 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 PCOS (polycystic ovarian syndrome) 07/13/2012    Current Outpatient Prescriptions on File Prior to Visit  Medication Sig Dispense Refill  . gabapentin (NEURONTIN) 300 MG capsule Take 2 capsules (600 mg total) by mouth 3 (three) times daily. 180 capsule 0  . levonorgestrel (MIRENA) 20 MCG/24HR IUD 1 each by Intrauterine Choi once.    . metFORMIN (GLUCOPHAGE) 500 MG tablet TAKE 1 TABLET(500 MG) BY MOUTH TWICE DAILY WITH A MEAL 180 tablet 0  . spironolactone (ALDACTONE) 25 MG tablet TAKE 2 TABLETS BY MOUTH DAILY 180 tablet 0  . Vitamin D, Ergocalciferol, (DRISDOL) 50000 units CAPS capsule Take 1 capsule (50,000 Units total) by mouth every 7 (seven) days. 12 capsule 0   No current facility-administered medications on file prior to visit.     Allergies  Allergen Reactions  . Iodine Rash    Pt patients past, family and social history were reviewed and updated.   Objective:  BP 128/76 (BP Location: Right Arm, Patient Position: Sitting, Cuff Size: Large)   Pulse 74   Temp 97.6 F (36.4 C) (Oral)   Ht 5\' 3"  (1.6 m)   Wt 237 lb (107.5 kg)   SpO2 98%   BMI 41.98 kg/m   Physical Exam  Constitutional: She is oriented to person, place, and time and well-developed, well-nourished, and in no distress.  HENT:  Head:  Normocephalic and atraumatic.  Right Ear: Hearing and external ear normal.  Left Ear: Hearing and external ear normal.  Eyes: Conjunctivae are normal.  Neck: Normal range of motion.  Pulmonary/Chest: Effort normal.  Neurological: She is alert and oriented to person, place, and time. Gait normal.  Skin: Skin is warm and dry.  Psychiatric: Mood, memory, affect and judgment normal.  Vitals reviewed.   Assessment and Plan :  Coccyxdynia - Plan: oxyCODONE-acetaminophen (ROXICET) 5-325 MG tablet, oxyCODONE-acetaminophen (ROXICET) 5-325 MG tablet, oxyCODONE-acetaminophen (ROXICET) 5-325 MG tablet   Medication give for the next 3 months - she will schedule her f/u appt today - keep up the great weight loss  Benny LennertSarah Sumner Boesch PA-C  [facility] at [facility] 09/22/2016 9:14 AM

## 2016-10-16 IMAGING — CR DG SACRUM/COCCYX 2+V
3 series · 3 of 3 positions shown · non-contrast
Comparison: None.

CLINICAL DATA: Patient reports coccygeal region pain for years,
worsening more recently. No injury.

EXAM:
SACRUM AND COCCYX - 2+ VIEW

[AP]
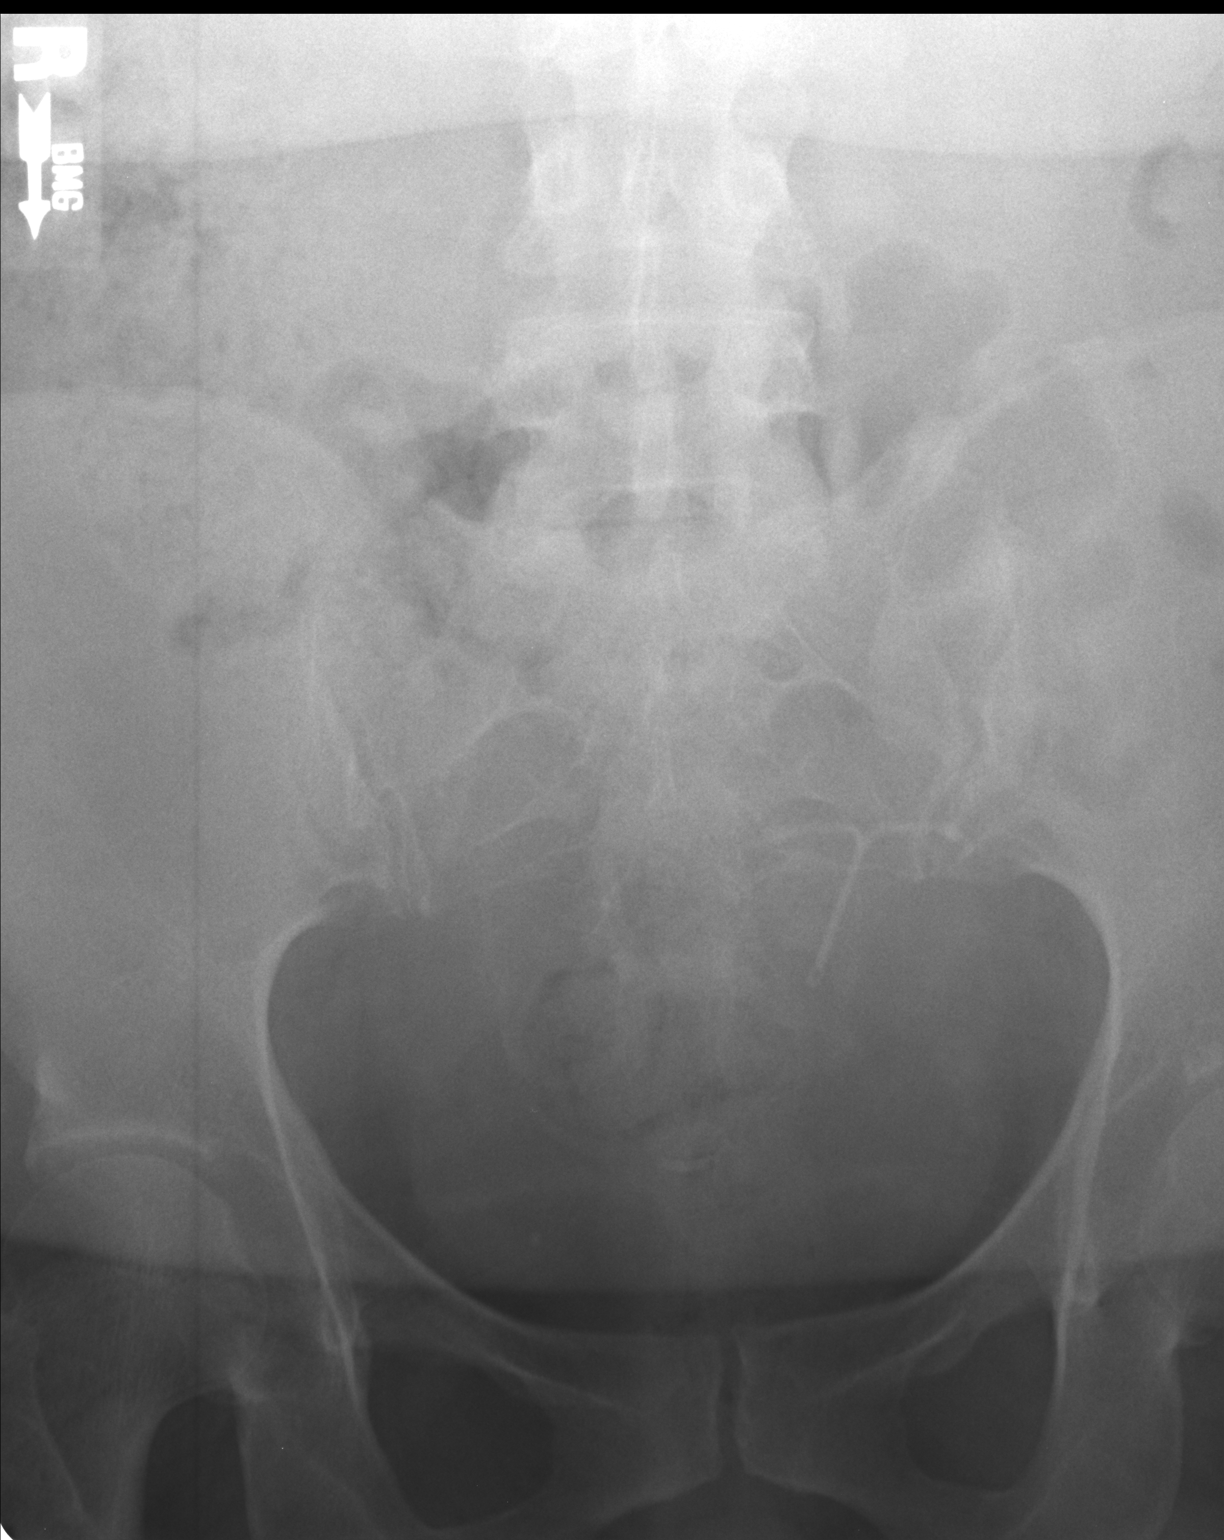

[ap axial]
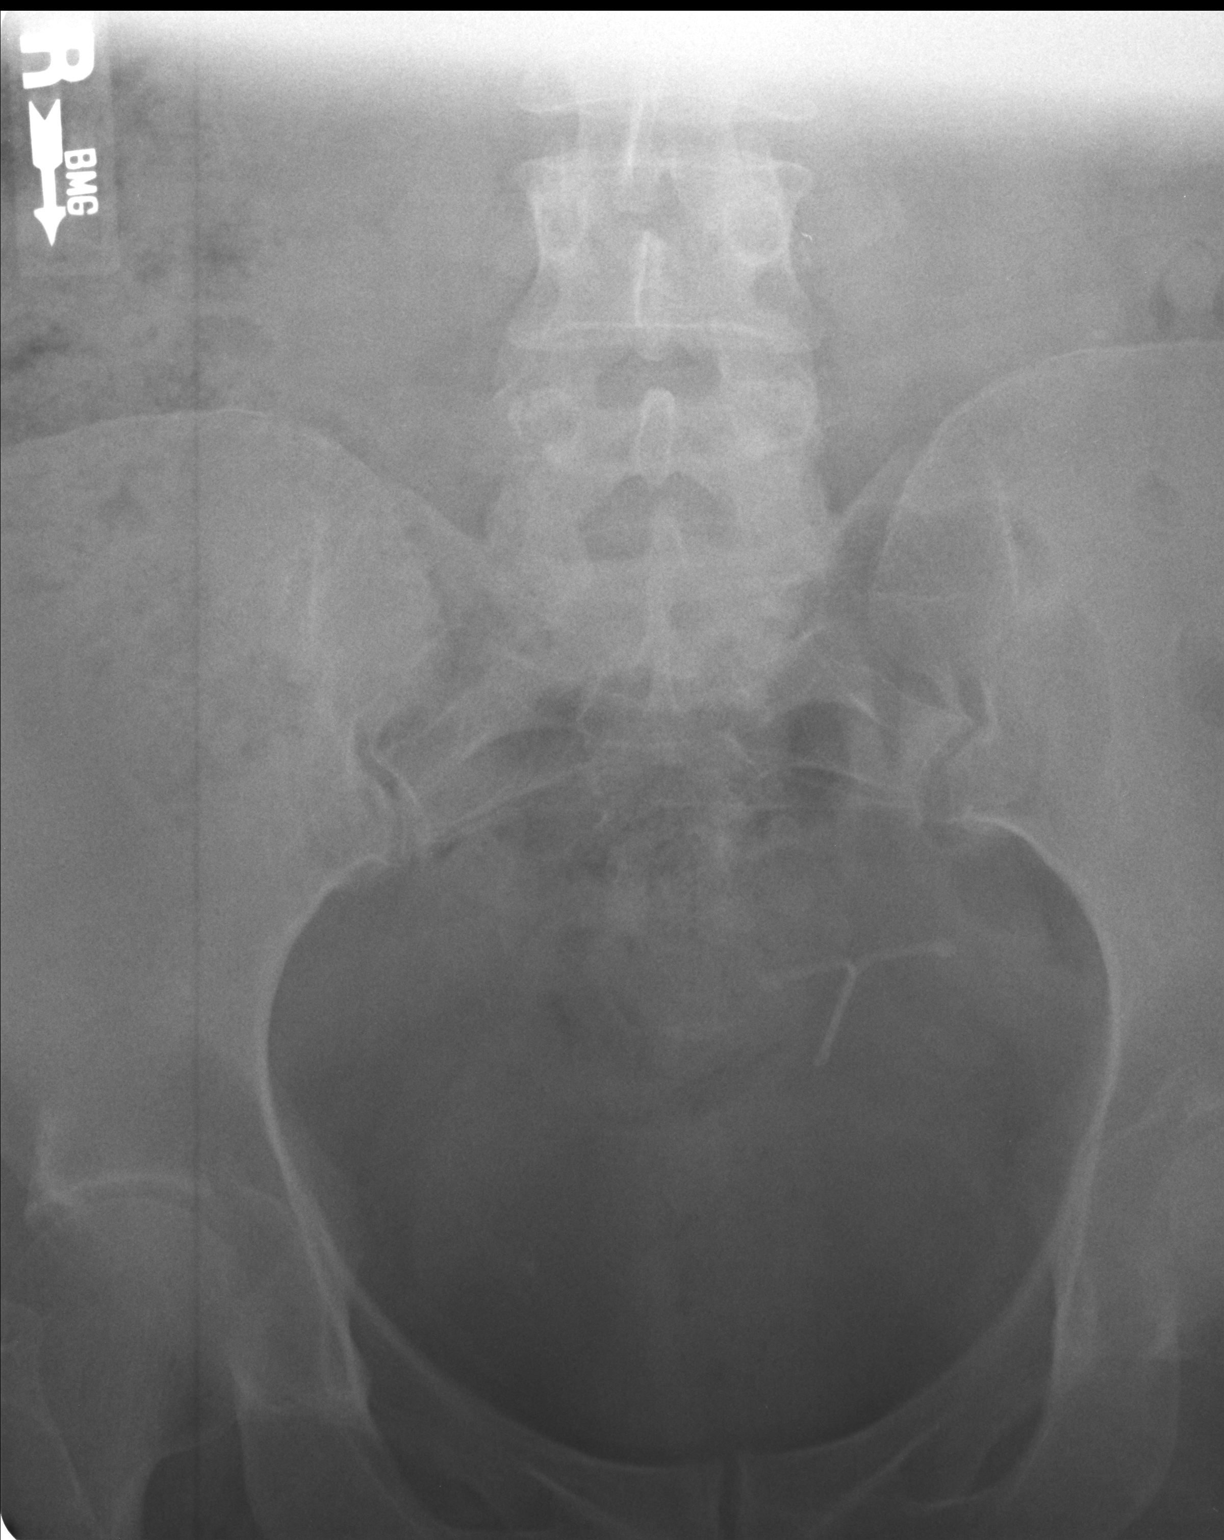

[lateral]
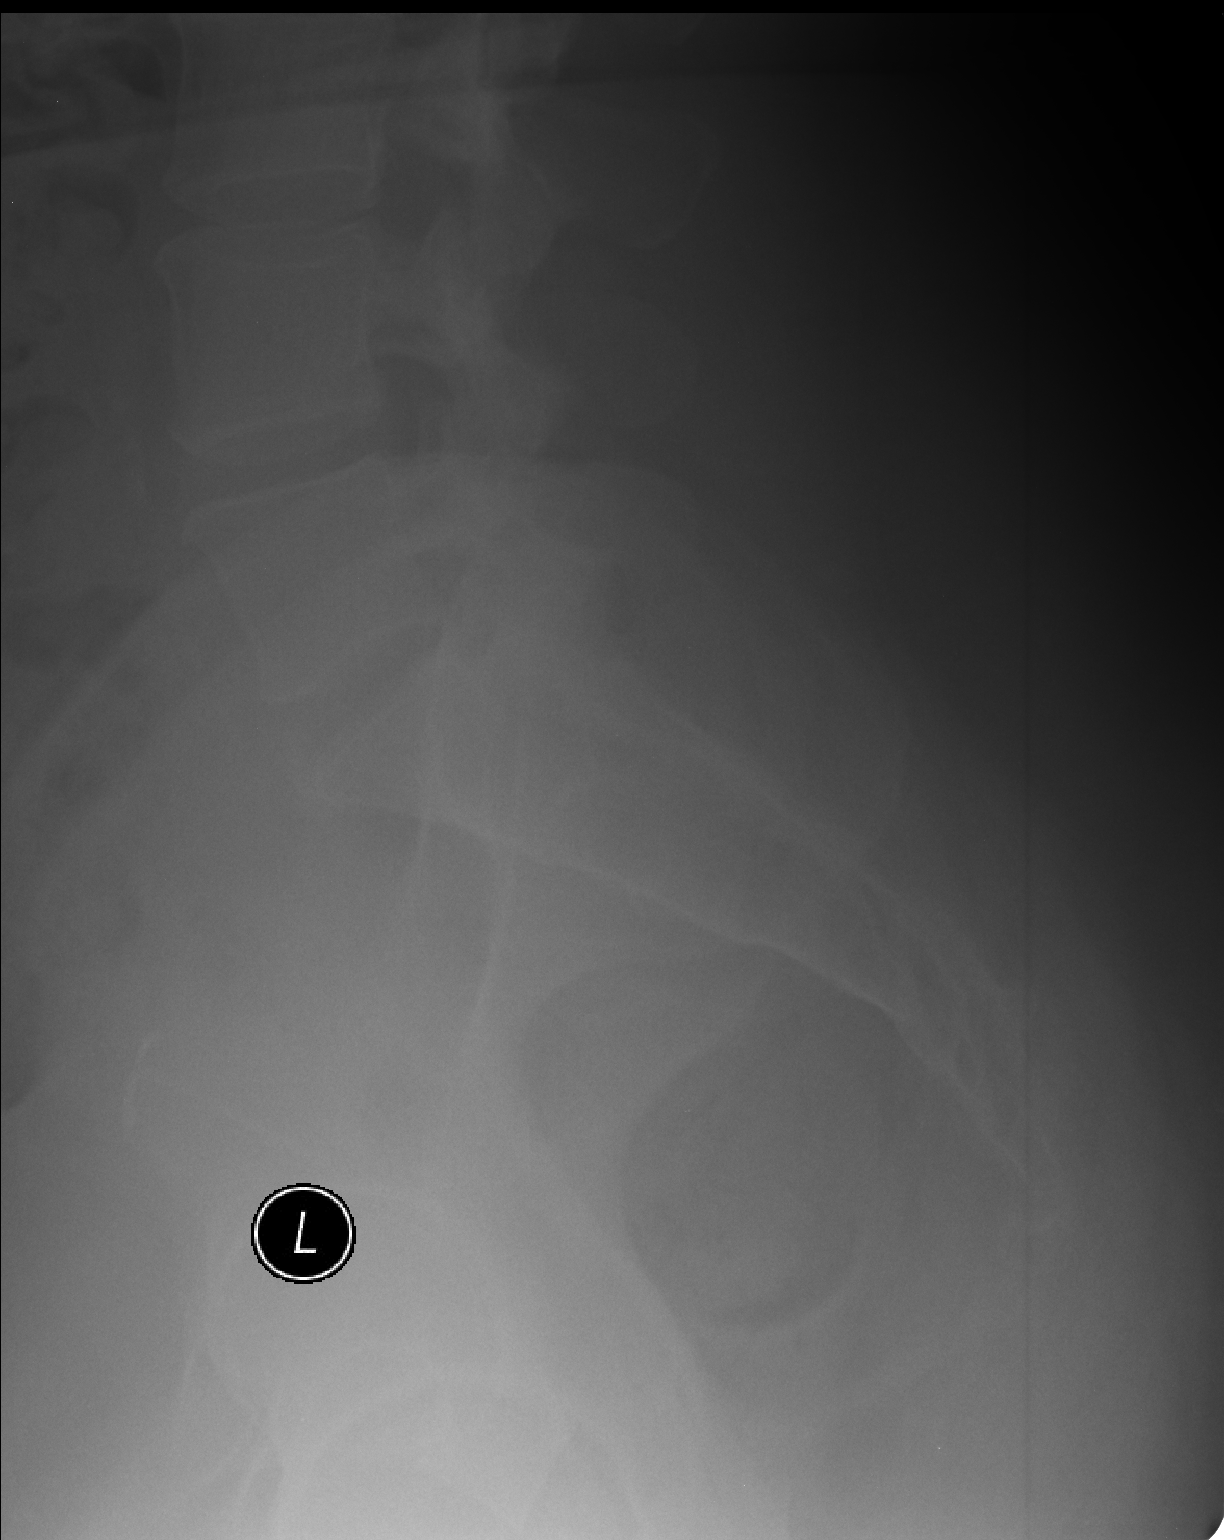

[3 of 3 positions shown; findings below may reference images not displayed]

FINDINGS: No fracture. No bone lesion. SI joints are normally space and
aligned. IUD lies in the left central pelvis. Soft tissues are
otherwise unremarkable..
IMPRESSION: Negative.

## 2016-11-01 ENCOUNTER — Other Ambulatory Visit: Payer: Self-pay | Admitting: Physician Assistant

## 2016-12-08 ENCOUNTER — Ambulatory Visit: Payer: 59 | Admitting: Physician Assistant

## 2016-12-12 ENCOUNTER — Telehealth: Payer: Self-pay | Admitting: Physician Assistant

## 2016-12-12 ENCOUNTER — Encounter: Payer: Self-pay | Admitting: Physician Assistant

## 2016-12-12 DIAGNOSIS — M533 Sacrococcygeal disorders, not elsewhere classified: Secondary | ICD-10-CM

## 2016-12-12 NOTE — Telephone Encounter (Signed)
Pt is going to be out of her oxycodone on the 8th and she has an appt with sarah on the 10th and would like  A refill  Best number [phone]

## 2016-12-12 NOTE — Telephone Encounter (Signed)
09/22/16 last 3 rx

## 2016-12-15 MED ORDER — OXYCODONE-ACETAMINOPHEN 5-325 MG PO TABS: 1.0000 | ORAL_TABLET | Freq: Three times a day (TID) | ORAL | 0 refills | Status: DC | PRN

## 2016-12-15 NOTE — Telephone Encounter (Signed)
Rx printed. Will sign after clinic.  Meds ordered this encounter  Medications  . oxyCODONE-acetaminophen (ROXICET) 5-325 MG tablet    Sig: Take 1 tablet by mouth every 8 (eight) hours as needed.    Dispense:  75 tablet    Refill:  0    Order Specific Question:   Supervising Provider    Answer:   Neva Seat, JEFFREY R [[id number]]

## 2016-12-15 NOTE — Telephone Encounter (Signed)
Chelle - will you please print and signed for me. Thanks.

## 2016-12-22 ENCOUNTER — Ambulatory Visit (INDEPENDENT_AMBULATORY_CARE_PROVIDER_SITE_OTHER): Payer: 59 | Admitting: Physician Assistant

## 2016-12-22 ENCOUNTER — Encounter: Payer: Self-pay | Admitting: Physician Assistant

## 2016-12-22 VITALS — BP 130/86 | HR 77 | Temp 97.5°F | Resp 18 | Ht 63.0 in | Wt 224.0 lb

## 2016-12-22 DIAGNOSIS — E559 Vitamin D deficiency, unspecified: Secondary | ICD-10-CM | POA: Diagnosis not present

## 2016-12-22 DIAGNOSIS — E669 Obesity, unspecified: Secondary | ICD-10-CM

## 2016-12-22 DIAGNOSIS — E282 Polycystic ovarian syndrome: Secondary | ICD-10-CM | POA: Diagnosis not present

## 2016-12-22 DIAGNOSIS — E7800 Pure hypercholesterolemia, unspecified: Secondary | ICD-10-CM | POA: Diagnosis not present

## 2016-12-22 DIAGNOSIS — Z114 Encounter for screening for human immunodeficiency virus [HIV]: Secondary | ICD-10-CM | POA: Diagnosis not present

## 2016-12-22 DIAGNOSIS — M533 Sacrococcygeal disorders, not elsewhere classified: Secondary | ICD-10-CM | POA: Diagnosis not present

## 2016-12-22 DIAGNOSIS — G894 Chronic pain syndrome: Secondary | ICD-10-CM | POA: Diagnosis not present

## 2016-12-22 MED ORDER — METFORMIN HCL 500 MG PO TABS: 500.0000 mg | ORAL_TABLET | Freq: Two times a day (BID) | ORAL | 0 refills | Status: DC

## 2016-12-22 MED ORDER — GABAPENTIN 300 MG PO CAPS: 600.0000 mg | ORAL_CAPSULE | Freq: Three times a day (TID) | ORAL | 0 refills | Status: DC

## 2016-12-22 MED ORDER — OXYCODONE-ACETAMINOPHEN 5-325 MG PO TABS: 1.0000 | ORAL_TABLET | Freq: Three times a day (TID) | ORAL | 0 refills | Status: DC | PRN

## 2016-12-22 MED ORDER — SPIRONOLACTONE 25 MG PO TABS: 50.0000 mg | ORAL_TABLET | Freq: Every day | ORAL | 3 refills | Status: DC

## 2016-12-22 NOTE — Assessment & Plan Note (Signed)
Currently on OTC Vit D 5000U bid - check labs and adjust as indicated

## 2016-12-22 NOTE — Progress Notes (Signed)
Jeanne Choi  MRN: [id number] DOB: [birth year]  PCP: Jeanne Choi  Chief Complaint  Patient presents with  . Medication Refill    Metformin, Gabapentin, Spironolactone, Oxycodone    Subjective:  Pt presents to clinic for medication recheck.  She has been doing really well.    Still doing keto diet and has lost significant amount of weight - she is not exercising. She feels better with the weight loss but her weight loss has not changed her pain levels.  Chronic pain - Pain is really fluctuating - she is unable to take the whole pill because it makes her to groggy but she finds she takes 1/2 pill and then 1-2 hours later she takes another one for pain and that is good for many ours. Her dosing has not changed with her weight loss. Good days - 2 fulls pills if working, if home 1.5 (if she has taken less than 1/5 she feels bad like withdraw symptoms) Bad days - 2.5-3 pills - she tries to not go over 2.5 pills  Review of Systems  Patient Active Problem List   Diagnosis Date Noted  . Chronic pain syndrome 04/28/2016  . Adjustment reaction with anxiety and depression 10/31/2015  . Obesity, Class II, BMI 35-39.9 04/01/2013  . 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 PCOS (polycystic ovarian syndrome) 07/13/2012    Current Outpatient Prescriptions on File Prior to Visit  Medication Sig Dispense Refill  . levonorgestrel (MIRENA) 20 MCG/24HR IUD 1 each by Intrauterine route once.     No current facility-administered medications on file prior to visit.     Allergies  Allergen Reactions  . Iodine Rash    Pt patients past, family and social history were reviewed and updated.   Objective:  BP 130/86   Pulse 77   Temp 97.5 F (36.4 C) (Oral)   Resp 18   Ht '5\' 3"'  (1.6 m)   Wt 224 lb (101.6 kg)   SpO2 97%   BMI 39.68 kg/m   Physical Exam  Constitutional: She is oriented to person, place, and time and well-developed, well-nourished, and in no distress.  HENT:  Head:  Normocephalic and atraumatic.  Right Ear: Hearing and external ear normal.  Left Ear: Hearing and external ear normal.  Eyes: Conjunctivae are normal.  Neck: Normal range of motion.  Cardiovascular: Normal rate, regular rhythm and normal heart sounds.   No murmur heard. Pulmonary/Chest: Effort normal and breath sounds normal. She has no wheezes.  Neurological: She is alert and oriented to person, place, and time. Gait normal.  Skin: Skin is warm and dry.  Psychiatric: Mood, memory, affect and judgment normal.  Vitals reviewed.   Wt Readings from Last 3 Encounters:  12/22/16 224 lb (101.6 kg)  09/22/16 237 lb (107.5 kg)  04/28/16 269 lb (122 kg)    Assessment and Plan :   Problem List Items Addressed This Visit      Endocrine   PCOS (polycystic ovarian syndrome) - Primary (Chronic)    Continue current treatment of Metformin and Spironolactone.  Check labs,      Relevant Medications   spironolactone (ALDACTONE) 25 MG tablet   Other Relevant Orders   Hemoglobin A1c     Other   Coccyxdynia (Chronic)    Continue current medications.      Relevant Medications   oxyCODONE-acetaminophen (ROXICET) 5-325 MG tablet   oxyCODONE-acetaminophen (ROXICET) 5-325 MG tablet   gabapentin (NEURONTIN) 300 MG capsule   Obesity, Class II, BMI 35-39.9  Doing great with her weight loss.  Encouraged her to continue.      Relevant Medications   metFORMIN (GLUCOPHAGE) 500 MG tablet   Chronic pain syndrome    Continue current treatment.  Check urine drug screen today.  Pain contract signed again today.      Relevant Orders   ToxASSURE Select 13 (MW), Urine    Other Visit Diagnoses    Pure hypercholesterolemia       Relevant Medications   spironolactone (ALDACTONE) 25 MG tablet   Other Relevant Orders   CMP14+EGFR   Lipid panel   Vitamin D deficiency       Relevant Orders   VITAMIN D 25 Hydroxy (Vit-D Deficiency, Fractures)   Screening for HIV (human immunodeficiency virus)         Relevant Orders   HIV antibody       Windell Hummingbird PA-C  Primary Care at [facility] 12/22/2016 2:29 PM

## 2016-12-22 NOTE — Assessment & Plan Note (Signed)
Doing great with her weight loss.  Encouraged her to continue.

## 2016-12-22 NOTE — Patient Instructions (Signed)
     IF you received an x-ray today, you will receive an invoice from [facility]. Please contact [facility] at [phone] with questions or concerns regarding your invoice.   IF you received labwork today, you will receive an invoice from [facility]. Please contact [facility] at [phone] with questions or concerns regarding your invoice.   Our billing staff will not be able to assist you with questions regarding bills from these companies.  You will be contacted with the lab results as soon as they are available. The fastest way to get your results is to activate your My Chart account. Instructions are located on the last page of this paperwork. If you have not heard from us regarding the results in 2 weeks, please contact this office.     

## 2016-12-22 NOTE — Assessment & Plan Note (Signed)
Continue current treatment of Metformin and Spironolactone.  Check labs,

## 2016-12-22 NOTE — Assessment & Plan Note (Signed)
Continue current treatment.  Check urine drug screen today.  Pain contract signed again today.

## 2016-12-22 NOTE — Assessment & Plan Note (Signed)
Continue current medications. 

## 2016-12-25 LAB — CMP14+EGFR
A/G RATIO: 1.5 (ref 1.2–2.2)
ALT: 22 IU/L (ref 0–32)
AST: 22 IU/L (ref 0–40)
Albumin: 4.4 g/dL (ref 3.5–5.5)
Alkaline Phosphatase: 66 IU/L (ref 39–117)
BUN/Creatinine Ratio: 23 (ref 9–23)
BUN: 17 mg/dL (ref 6–24)
Bilirubin Total: 0.2 mg/dL (ref 0.0–1.2)
CO2: 22 mmol/L (ref 18–29)
Calcium: 10.2 mg/dL (ref 8.7–10.2)
Chloride: 99 mmol/L (ref 96–106)
Creatinine, Ser: 0.74 mg/dL (ref 0.57–1.00)
GFR calc non Af Amer: 97 mL/min/{1.73_m2} (ref >59)
GFR, EST AFRICAN AMERICAN: 112 mL/min/{1.73_m2} (ref >59)
GLOBULIN, TOTAL: 2.9 g/dL (ref 1.5–4.5)
Glucose: 96 mg/dL (ref 65–99)
POTASSIUM: 4.5 mmol/L (ref 3.5–5.2)
SODIUM: 138 mmol/L (ref 134–144)
TOTAL PROTEIN: 7.3 g/dL (ref 6.0–8.5)

## 2016-12-25 LAB — LIPID PANEL
Chol/HDL Ratio: 4.3 ratio (ref 0.0–4.4)
Cholesterol, Total: 264 mg/dL — ABNORMAL HIGH (ref 100–199)
HDL: 62 mg/dL (ref >39)
LDL Calculated: 170 mg/dL — ABNORMAL HIGH (ref 0–99)
Triglycerides: 162 mg/dL — ABNORMAL HIGH (ref 0–149)
VLDL Cholesterol Cal: 32 mg/dL (ref 5–40)

## 2016-12-25 LAB — HEMOGLOBIN A1C
Est. average glucose Bld gHb Est-mCnc: 114 mg/dL
Hgb A1c MFr Bld: 5.6 % (ref 4.8–5.6)

## 2016-12-25 LAB — VITAMIN D 25 HYDROXY (VIT D DEFICIENCY, FRACTURES): VIT D 25 HYDROXY: 46.9 ng/mL (ref 30.0–100.0)

## 2016-12-25 LAB — HIV ANTIBODY (ROUTINE TESTING W REFLEX): HIV SCREEN 4TH GENERATION: NONREACTIVE

## 2016-12-25 LAB — TOXASSURE SELECT 13 (MW), URINE

## 2016-12-28 ENCOUNTER — Encounter: Payer: Self-pay | Admitting: Physician Assistant

## 2016-12-28 ENCOUNTER — Ambulatory Visit (INDEPENDENT_AMBULATORY_CARE_PROVIDER_SITE_OTHER): Payer: 59 | Admitting: Physician Assistant

## 2016-12-28 ENCOUNTER — Telehealth: Payer: Self-pay | Admitting: Family Medicine

## 2016-12-28 VITALS — BP 129/87 | HR 97 | Temp 97.3°F | Ht 63.0 in | Wt 224.6 lb

## 2016-12-28 DIAGNOSIS — M5441 Lumbago with sciatica, right side: Secondary | ICD-10-CM

## 2016-12-28 MED ORDER — PREDNISONE 20 MG PO TABS: ORAL_TABLET | ORAL | 0 refills | Status: AC

## 2016-12-28 NOTE — Patient Instructions (Signed)
     IF you received an x-ray today, you will receive an invoice from [facility]. Please contact [facility] at [phone] with questions or concerns regarding your invoice.   IF you received labwork today, you will receive an invoice from [facility]. Please contact [facility] at [phone] with questions or concerns regarding your invoice.   Our billing staff will not be able to assist you with questions regarding bills from these companies.  You will be contacted with the lab results as soon as they are available. The fastest way to get your results is to activate your My Chart account. Instructions are located on the last page of this paperwork. If you have not heard from us regarding the results in 2 weeks, please contact this office.     

## 2016-12-28 NOTE — Telephone Encounter (Signed)
Please compose the letter per her specifications. Deliah Boston, MS, PA-C 1:21 PM, 12/28/2016

## 2016-12-28 NOTE — Progress Notes (Signed)
12/28/2016 9:22 AM   DOB: 1970-10-04 / MRN: [id number]  SUBJECTIVE:  Jeanne Choi is a 46 y.o. female presenting for "buring searing pain" deep in her right buttocks.  Tells me she can not work like this.  She says she has a history of chronic nerve pain.  She has tried gabapentin 1800 mg daily and 5/325 Norco and says the pain is breaking through. She says that she can not work like this.  She tells me that she is having pain shooting pains down the right leg if she twist or sits.   She has lost over 100 lbs intentionally over the last 7 months.    She is allergic to iodine.   She  has a past medical history of Allergy; Diabetes mellitus without complication (HCC); and Neuromuscular disorder (HCC).    She  reports that she quit smoking about 6 years ago. She has never used smokeless tobacco. She reports that she drinks about 1.2 oz of alcohol per week . She reports that she does not use drugs. She  reports that she currently engages in sexual activity. The patient  has a past surgical history that includes Cholecystectomy and Pilonidal cyst / sinus excision.  Her family history includes Heart disease in her maternal grandfather and mother; Hypertension in her mother.  Review of Systems  Eyes: Negative.   Gastrointestinal: Negative for abdominal pain and nausea.  Genitourinary: Negative for dysuria, flank pain, frequency, hematuria and urgency.  Musculoskeletal: Positive for myalgias. Negative for falls.  Neurological: Positive for tingling. Negative for dizziness, sensory change, speech change, focal weakness and headaches.    The problem list and medications were reviewed and updated by myself where necessary and exist elsewhere in the encounter.   OBJECTIVE:  BP 129/87 (BP Location: Right Arm, Patient Position: Standing, Cuff Size: Large)   Pulse 97   Temp 97.3 F (36.3 C) (Oral)   Ht  (1.6 m)   Wt 224 lb 9.6 oz (101.9 kg)   SpO2 97%   BMI 39.79 kg/m   Physical Exam   Constitutional: She is oriented to person, place, and time. She is active.  Non-toxic appearance.  Eyes: EOM are normal. Pupils are equal, round, and reactive to light.  Cardiovascular: Normal rate.   Pulmonary/Chest: Effort normal. No stridor. No tachypnea. No respiratory distress. She has no wheezes. She has no rales.  Musculoskeletal: She exhibits tenderness (right piriformis).  Neurological: She is alert and oriented to person, place, and time. She has normal strength and normal reflexes. She is not disoriented. She displays no atrophy. No cranial nerve deficit or sensory deficit. She exhibits normal muscle tone. Coordination and gait normal.  Skin: Skin is warm and dry. She is not diaphoretic. No pallor.  Psychiatric: Her behavior is normal.    No results found for this or any previous visit (from the past 72 hour(s)).  No results found.  ASSESSMENT AND PLAN:  Rachyl was seen today for butt pain.  Diagnoses and all orders for this visit:  Acute right-sided low back pain with right sided sciatica: options discussed.  She wants a lidocaine shot here.  Advised that this is not a service we typically provide however I would be happy to refer her.  She declined referral.  She will try prednisone given given radicular symptoms.  -     predniSONE (DELTASONE) 20 MG tablet; Take 3 in the morning for 3 days, then 2 in the morning for 3 days, and then 1 in  the morning for 3 days. -     Care order/instruction:    The patient is advised to call or return to clinic if she does not see an improvement in symptoms, or to seek the care of the closest emergency department if she worsens with the above plan.   Deliah Boston, MHS, PA-C [facility] 12/28/2016 9:22 AM

## 2016-12-28 NOTE — Telephone Encounter (Signed)
Will you write this letter so she can be reimbursed?

## 2016-12-28 NOTE — Telephone Encounter (Signed)
MICHAEL CLARK PATIENT CALLED STATING THAT SHE NEED A LETTER WITH DX ON IT FOR FSA /INSURANCE FOR THE RX LIDOCAINE PATCH SHE SAYS THAT SHE PAID OUT OF POCKET FOR IT AND THE LETTER WILL REMBURST HER THE MONEY IF WE COULD EMA IL HER THE LETTER SHE SAID IT'LL BE GREAT GIVE PT A CALL WHEN READY

## 2017-01-01 ENCOUNTER — Encounter: Payer: Self-pay | Admitting: Physician Assistant

## 2017-01-04 ENCOUNTER — Encounter: Payer: Self-pay | Admitting: Physician Assistant

## 2017-01-14 ENCOUNTER — Telehealth: Payer: Self-pay | Admitting: Physician Assistant

## 2017-01-14 NOTE — Telephone Encounter (Signed)
Please advise 

## 2017-01-14 NOTE — Telephone Encounter (Signed)
Pt and pharmacy is needing to confirm if it is ok to fill the percocet filled today instead of the 10th   Best number is [phone] [facility]  Pt wanted to remind [name] that she took more due to a pinch nerve which she was seen for

## 2017-01-15 NOTE — Telephone Encounter (Signed)
That is fine to fill a few days early - please call the pharmacy and let them know.

## 2017-01-15 NOTE — Telephone Encounter (Signed)
Verbal ok to fill eraly

## 2017-02-09 ENCOUNTER — Other Ambulatory Visit: Payer: Self-pay | Admitting: Physician Assistant

## 2017-02-09 DIAGNOSIS — M533 Sacrococcygeal disorders, not elsewhere classified: Secondary | ICD-10-CM

## 2017-02-09 DIAGNOSIS — E282 Polycystic ovarian syndrome: Secondary | ICD-10-CM

## 2017-02-10 NOTE — Telephone Encounter (Signed)
12/28/16 last ov

## 2017-02-10 NOTE — Telephone Encounter (Signed)
Patient also needs her oxyCODONE-acetaminophen (ROXICET) 5-325 MG tablet refilled along with her spironolactone (ALDACTONE) 25 MG tablet. She will be completely out of her Oxycodone on June 4th

## 2017-02-11 NOTE — Telephone Encounter (Signed)
Can someone call patient and let her know why these medications were refused. Thank you

## 2017-02-12 MED ORDER — OXYCODONE-ACETAMINOPHEN 5-325 MG PO TABS: 1.0000 | ORAL_TABLET | Freq: Three times a day (TID) | ORAL | 0 refills | Status: DC | PRN

## 2017-02-12 MED ORDER — SPIRONOLACTONE 25 MG PO TABS: 50.0000 mg | ORAL_TABLET | Freq: Every day | ORAL | 3 refills | Status: DC

## 2017-02-12 NOTE — Telephone Encounter (Addendum)
I have no idea why they were denied.  I have refilled both -

## 2017-02-12 NOTE — Addendum Note (Signed)
Addended by: Morrell RiddleWEBER, SARAH L on: 02/12/2017 04:21 PM   Modules accepted: Orders

## 2017-02-12 NOTE — Addendum Note (Signed)
Addended by: Clarene CritchleyKOLLER, Tiye Huwe M on: 02/12/2017 08:22 AM   Modules accepted: Orders

## 2017-02-12 NOTE — Telephone Encounter (Signed)
spironolactone denied correct? Why?  Also requesting oxy Last refill 12/22/16

## 2017-02-13 NOTE — Telephone Encounter (Signed)
Pt advised.

## 2017-03-02 ENCOUNTER — Ambulatory Visit (INDEPENDENT_AMBULATORY_CARE_PROVIDER_SITE_OTHER): Payer: 59 | Admitting: Physician Assistant

## 2017-03-02 ENCOUNTER — Encounter: Payer: Self-pay | Admitting: Physician Assistant

## 2017-03-02 VITALS — BP 127/82 | HR 77 | Temp 97.9°F | Resp 18 | Ht 63.0 in | Wt 222.4 lb

## 2017-03-02 DIAGNOSIS — M533 Sacrococcygeal disorders, not elsewhere classified: Secondary | ICD-10-CM

## 2017-03-02 DIAGNOSIS — E282 Polycystic ovarian syndrome: Secondary | ICD-10-CM | POA: Diagnosis not present

## 2017-03-02 DIAGNOSIS — G894 Chronic pain syndrome: Secondary | ICD-10-CM | POA: Diagnosis not present

## 2017-03-02 MED ORDER — OXYCODONE-ACETAMINOPHEN 5-325 MG PO TABS: 1.0000 | ORAL_TABLET | Freq: Three times a day (TID) | ORAL | 0 refills | Status: DC | PRN

## 2017-03-02 MED ORDER — GABAPENTIN 300 MG PO CAPS: 600.0000 mg | ORAL_CAPSULE | Freq: Three times a day (TID) | ORAL | 3 refills | Status: DC

## 2017-03-02 MED ORDER — METFORMIN HCL 500 MG PO TABS: 500.0000 mg | ORAL_TABLET | Freq: Two times a day (BID) | ORAL | 3 refills | Status: DC

## 2017-03-02 NOTE — Patient Instructions (Signed)
     IF you received an x-ray today, you will receive an invoice from [facility]. Please contact  Radiology at [phone] with questions or concerns regarding your invoice.   IF you received labwork today, you will receive an invoice from [facility]. Please contact [facility] at [phone] with questions or concerns regarding your invoice.   Our billing staff will not be able to assist you with questions regarding bills from these companies.  You will be contacted with the lab results as soon as they are available. The fastest way to get your results is to activate your My Chart account. Instructions are located on the last page of this paperwork. If you have not heard from us regarding the results in 2 weeks, please contact this office.     

## 2017-03-02 NOTE — Progress Notes (Addendum)
   Jeanne OhmsKristine Choi  MRN: [id number] DOB: 04/21/1971  PCP: Morrell RiddleWeber, Lulu Hirschmann L, PA-C  Chief Complaint  Patient presents with  . 3 month follow up    Subjective:  Pt presents to clinic for   Review of Systems  Patient Active Problem List   Diagnosis Date Noted  . Vitamin D deficiency 12/22/2016  . Chronic pain syndrome 04/28/2016  . Adjustment reaction with anxiety and depression 10/31/2015  . Obesity, Class II, BMI 35-39.9 04/01/2013  . 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 PCOS (polycystic ovarian syndrome) 07/13/2012    Current Outpatient Prescriptions on File Prior to Visit  Medication Sig Dispense Refill  . levonorgestrel (MIRENA) 20 MCG/24HR IUD 1 each by Intrauterine route once.    Marland Kitchen. spironolactone (ALDACTONE) 25 MG tablet Take 2 tablets (50 mg total) by mouth daily. 180 tablet 3   No current facility-administered medications on file prior to visit.     Allergies  Allergen Reactions  . Iodine Rash    Pt patients past, family and social history were reviewed and updated.   Objective:  BP 127/82   Pulse 77   Temp 97.9 F (36.6 C) (Oral)   Resp 18   Ht 5\' 3"  (1.6 m)   Wt 222 lb 6.4 oz (100.9 kg)   LMP  (LMP Unknown)   SpO2 97%   BMI 39.40 kg/m   Physical Exam  Constitutional: She is oriented to person, place, and time and well-developed, well-nourished, and in no distress.  HENT:  Head: Normocephalic and atraumatic.  Right Ear: Hearing and external ear normal.  Left Ear: Hearing and external ear normal.  Eyes: Conjunctivae are normal.  Neck: Normal range of motion.  Pulmonary/Chest: Effort normal.  Neurological: She is alert and oriented to person, place, and time. Gait normal.  Skin: Skin is warm and dry.  Psychiatric: Mood, memory, affect and judgment normal.  Vitals reviewed.   Wt Readings from Last 3 Encounters:  03/02/17 222 lb 6.4 oz (100.9 kg)  12/28/16 224 lb 9.6 oz (101.9 kg)  12/22/16 224 lb (101.6 kg)    Assessment and Plan :  Chronic pain  syndrome - Plan: Ambulatory referral to Physical Therapy  Coccyxdynia - Plan: gabapentin (NEURONTIN) 300 MG capsule, oxyCODONE-acetaminophen (ROXICET) 5-325 MG tablet, oxyCODONE-acetaminophen (ROXICET) 5-325 MG tablet, oxyCODONE-acetaminophen (ROXICET) 5-325 MG tablet, Ambulatory referral to Physical Therapy  PCOS (polycystic ovarian syndrome) - Plan: metFORMIN (GLUCOPHAGE) 500 MG tablet   Recheck in 3 months - pt is doing good - encouraged continue healthy eating.  Will recheck cholesterol fasting next visit.  Benny LennertSarah Thomasena Vandenheuvel PA-C  [facility] at [facility] 03/02/2017 8:43 AM

## 2017-03-02 NOTE — Addendum Note (Signed)
Addended by: Morrell RiddleWEBER, Bryar Dahms L on: 03/02/2017 08:43 AM   Modules accepted: Orders

## 2017-03-11 ENCOUNTER — Encounter: Payer: Self-pay | Admitting: Physician Assistant

## 2017-03-12 NOTE — Telephone Encounter (Signed)
Please call the pharmacy and let them know it is ok if she fills this month Rx early.

## 2017-05-25 ENCOUNTER — Encounter: Payer: Self-pay | Admitting: Physician Assistant

## 2017-05-25 ENCOUNTER — Ambulatory Visit (INDEPENDENT_AMBULATORY_CARE_PROVIDER_SITE_OTHER): Payer: 59 | Admitting: Physician Assistant

## 2017-05-25 VITALS — BP 134/84 | HR 84 | Temp 98.2°F | Resp 18 | Ht 63.0 in | Wt 214.2 lb

## 2017-05-25 DIAGNOSIS — E7800 Pure hypercholesterolemia, unspecified: Secondary | ICD-10-CM

## 2017-05-25 DIAGNOSIS — G894 Chronic pain syndrome: Secondary | ICD-10-CM | POA: Diagnosis not present

## 2017-05-25 DIAGNOSIS — K649 Unspecified hemorrhoids: Secondary | ICD-10-CM

## 2017-05-25 DIAGNOSIS — M533 Sacrococcygeal disorders, not elsewhere classified: Secondary | ICD-10-CM

## 2017-05-25 DIAGNOSIS — E669 Obesity, unspecified: Secondary | ICD-10-CM | POA: Diagnosis not present

## 2017-05-25 DIAGNOSIS — Z79899 Other long term (current) drug therapy: Secondary | ICD-10-CM | POA: Diagnosis not present

## 2017-05-25 MED ORDER — HYDROCORTISONE 2.5 % RE CREA: 1.0000 "application " | TOPICAL_CREAM | Freq: Two times a day (BID) | RECTAL | 5 refills | Status: AC

## 2017-05-25 MED ORDER — GABAPENTIN 300 MG PO CAPS: ORAL_CAPSULE | ORAL | 3 refills | Status: DC

## 2017-05-25 MED ORDER — OXYCODONE-ACETAMINOPHEN 5-325 MG PO TABS: 1.0000 | ORAL_TABLET | Freq: Three times a day (TID) | ORAL | 0 refills | Status: DC | PRN

## 2017-05-25 NOTE — Patient Instructions (Signed)
     IF you received an x-ray today, you will receive an invoice from [facility]. Please contact [facility] at [phone] with questions or concerns regarding your invoice.   IF you received labwork today, you will receive an invoice from [facility]. Please contact [facility] at [phone] with questions or concerns regarding your invoice.   Our billing staff will not be able to assist you with questions regarding bills from these companies.  You will be contacted with the lab results as soon as they are available. The fastest way to get your results is to activate your My Chart account. Instructions are located on the last page of this paperwork. If you have not heard from us regarding the results in 2 weeks, please contact this office.     

## 2017-05-25 NOTE — Progress Notes (Signed)
Talene Glastetter  MRN: [id number] DOB: December 26, 1970  PCP: Morrell Riddle, PA-C  Chief Complaint  Patient presents with  . Follow-up    nerve pain     Subjective:  Pt presents to clinic for medication refills.  Her days where she needs 3 pills are increasing - but these are almost all working days.  She seems to need it in the middle of the shift because of her sitting. She is ready to do PT and maybe accupuncture because she is really not happy with her need for increased pain medication.  She is still working on weight loss with keto diet - they have made some changes due to her high cholesterol - she has eaten and is not fasting and would like to not check her cholesterol today.  Soft stool but not as often since she has changed her diet.  When she has to go she has increased pain.  She also has increased pain around her menses.  Her menses has started to return as she is at the end of the Mirena and are quite heavy and cramping.  History is obtained by patient.  Review of Systems  Constitutional: Negative for chills and fever.  Eyes: Negative for visual disturbance.  Respiratory: Negative for shortness of breath.   Cardiovascular: Negative for chest pain, palpitations and leg swelling.  Neurological: Negative for dizziness, light-headedness and headaches.  Psychiatric/Behavioral: Negative for sleep disturbance.    Patient Active Problem List   Diagnosis Date Noted  . Vitamin D deficiency 12/22/2016  . Chronic pain syndrome 04/28/2016  . Adjustment reaction with anxiety and depression 10/31/2015  . Obesity, Class II, BMI 35-39.9 04/01/2013  . 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 PCOS (polycystic ovarian syndrome) 07/13/2012    Current Outpatient Prescriptions on File Prior to Visit  Medication Sig Dispense Refill  . gabapentin (NEURONTIN) 300 MG capsule Take 2 capsules (600 mg total) by mouth 3 (three) times daily. 540 capsule 3  . levonorgestrel (MIRENA) 20 MCG/24HR IUD 1 each  by Intrauterine route once.    . metFORMIN (GLUCOPHAGE) 500 MG tablet Take 1 tablet (500 mg total) by mouth 2 (two) times daily with a meal. 180 tablet 3  . oxyCODONE-acetaminophen (ROXICET) 5-325 MG tablet Take 1 tablet by mouth every 8 (eight) hours as needed. 75 tablet 0  . oxyCODONE-acetaminophen (ROXICET) 5-325 MG tablet Take 1 tablet by mouth every 8 (eight) hours as needed for severe pain. 75 tablet 0  . spironolactone (ALDACTONE) 25 MG tablet Take 2 tablets (50 mg total) by mouth daily. 180 tablet 3   No current facility-administered medications on file prior to visit.     Allergies  Allergen Reactions  . Iodine Rash    Past Medical History:  Diagnosis Date  . Allergy   . Diabetes mellitus without complication (HCC)   . Neuromuscular disorder [facility])    Social History   Social History Narrative   [employer]   3rd shift   Married - no children   Social History  Substance Use Topics  . Smoking status: Former Smoker    Quit date: 03/23/2010  . Smokeless tobacco: Never Used     Comment: patient uses vape  . Alcohol use 1.2 oz/week    2 Glasses of wine per week   family history includes Heart disease in her maternal grandfather and mother; Hypertension in her mother.     Objective:  BP 134/84   Pulse 84   Temp 98.2 F (  36.8 C) (Oral)   Resp 18   Ht 5\' 3"  (1.6 m)   Wt 214 lb 3.2 oz (97.2 kg)   SpO2 96%   BMI 37.94 kg/m  Body mass index is 37.94 kg/m.  Physical Exam  Constitutional: She is oriented to person, place, and time and well-developed, well-nourished, and in no distress.  HENT:  Head: Normocephalic and atraumatic.  Right Ear: Hearing and external ear normal.  Left Ear: Hearing and external ear normal.  Eyes: Conjunctivae are normal.  Neck: Normal range of motion.  Cardiovascular: Normal rate, regular rhythm and normal heart sounds.   No murmur heard. Pulmonary/Chest: Effort normal and breath sounds normal. She has no  wheezes.  Neurological: She is alert and oriented to person, place, and time. Gait normal.  Skin: Skin is warm and dry.  Psychiatric: Mood, memory, affect and judgment normal.  Vitals reviewed.    Wt Readings from Last 3 Encounters:  05/25/17 214 lb 3.2 oz (97.2 kg)  03/02/17 222 lb 6.4 oz (100.9 kg)  12/28/16 224 lb 9.6 oz (101.9 kg)     Assessment and Plan :   Problem List Items Addressed This Visit      Other   Obesity, Class II, BMI 35-39.9    Pt has continued to lose weight with her diet changes - she was encouraged to continue.      Elevated cholesterol    Continue weight loss and diet changes  We will check lab levels at next visit.      Coccyxdynia - Primary (Chronic)   Relevant Medications   oxyCODONE-acetaminophen (ROXICET) 5-325 MG tablet   oxyCODONE-acetaminophen (ROXICET) 5-325 MG tablet   oxyCODONE-acetaminophen (ROXICET) 5-325 MG tablet   gabapentin (NEURONTIN) 300 MG capsule   Other Relevant Orders   Ambulatory referral to Physical Therapy   Chronic pain syndrome    Medications refilled - her need has increased while at work due to sedentary work - she will start PT in hopes that it will help.  I referred her to integr[facility] therapies in hopes that they can do other modalities like acupuncture or dry needling as she is interested in willing for other pain treatments vs chronic medications.      Relevant Orders   Ambulatory referral to Physical Therapy    Other Visit Diagnoses    Hemorrhoids, unspecified hemorrhoid type       Relevant Medications   hydrocortisone (ANUSOL-HC) 2.5 % rectal cream   Medication management           Benny LennertSarah Weber PA-C  Primary Care at [facility] 05/25/2017 8:18 AM

## 2017-05-27 DIAGNOSIS — E7800 Pure hypercholesterolemia, unspecified: Secondary | ICD-10-CM | POA: Insufficient documentation

## 2017-05-27 NOTE — Assessment & Plan Note (Signed)
Medications refilled - her need has increased while at work due to sedentary work - she will start PT in hopes that it will help.  I referred her to integrative therapies in hopes that they can do other modalities like acupuncture or dry needling as she is interested in willing for other pain treatments vs chronic medications.

## 2017-05-27 NOTE — Assessment & Plan Note (Signed)
Pt has continued to lose weight with her diet changes - she was encouraged to continue.

## 2017-05-27 NOTE — Assessment & Plan Note (Signed)
Continue weight loss and diet changes  We will check lab levels at next visit.

## 2017-07-29 ENCOUNTER — Encounter: Payer: Self-pay | Admitting: Physician Assistant

## 2017-08-16 ENCOUNTER — Ambulatory Visit: Payer: 59 | Admitting: Family Medicine

## 2017-08-16 ENCOUNTER — Encounter: Payer: Self-pay | Admitting: Family Medicine

## 2017-08-16 ENCOUNTER — Other Ambulatory Visit: Payer: Self-pay

## 2017-08-16 VITALS — BP 122/84 | HR 75 | Ht 64.17 in | Wt 211.4 lb

## 2017-08-16 DIAGNOSIS — E7800 Pure hypercholesterolemia, unspecified: Secondary | ICD-10-CM | POA: Diagnosis not present

## 2017-08-16 DIAGNOSIS — G894 Chronic pain syndrome: Secondary | ICD-10-CM | POA: Diagnosis not present

## 2017-08-16 DIAGNOSIS — R7303 Prediabetes: Secondary | ICD-10-CM

## 2017-08-16 DIAGNOSIS — M533 Sacrococcygeal disorders, not elsewhere classified: Secondary | ICD-10-CM

## 2017-08-16 DIAGNOSIS — Z975 Presence of (intrauterine) contraceptive device: Secondary | ICD-10-CM

## 2017-08-16 DIAGNOSIS — E669 Obesity, unspecified: Secondary | ICD-10-CM | POA: Diagnosis not present

## 2017-08-16 LAB — POCT URINALYSIS DIP (MANUAL ENTRY)
Bilirubin, UA: NEGATIVE
Glucose, UA: NEGATIVE mg/dL
Leukocytes, UA: NEGATIVE
Nitrite, UA: NEGATIVE
Protein Ur, POC: NEGATIVE mg/dL
Spec Grav, UA: 1.02 (ref 1.010–1.025)
Urobilinogen, UA: 0.2 E.U./dL
pH, UA: 7 (ref 5.0–8.0)

## 2017-08-16 LAB — LIPID PANEL
Chol/HDL Ratio: 4.1 ratio (ref 0.0–4.4)
Cholesterol, Total: 315 mg/dL — ABNORMAL HIGH (ref 100–199)
HDL: 77 mg/dL (ref >39)
LDL Calculated: 216 mg/dL — ABNORMAL HIGH (ref 0–99)
Triglycerides: 109 mg/dL (ref 0–149)
VLDL Cholesterol Cal: 22 mg/dL (ref 5–40)

## 2017-08-16 LAB — HEMOGLOBIN A1C
Est. average glucose Bld gHb Est-mCnc: 111 mg/dL
Hgb A1c MFr Bld: 5.5 % (ref 4.8–5.6)

## 2017-08-16 MED ORDER — OXYCODONE-ACETAMINOPHEN 5-325 MG PO TABS: 1.0000 | ORAL_TABLET | Freq: Three times a day (TID) | ORAL | 0 refills | Status: DC | PRN

## 2017-08-16 NOTE — Patient Instructions (Addendum)
1. Physical therapy group that you were referred to was [facility]    IF you received an x-ray today, you will receive an invoice from [facility]. Please contact [facility] at [phone] with questions or concerns regarding your invoice.   IF you received labwork today, you will receive an invoice from [facility]. Please contact [facility] at [phone] with questions or concerns regarding your invoice.   Our billing staff will not be able to assist you with questions regarding bills from these companies.  You will be contacted with the lab results as soon as they are available. The fastest way to get your results is to activate your My Chart account. Instructions are located on the last page of this paperwork. If you have not heard from us regarding the results in 2 weeks, please contact this office.

## 2017-08-16 NOTE — Progress Notes (Signed)
12/3/20188:15 AM  Jeanne OhmsKristine Lampi 03/17/1971, 46 y.o. female [id number]  Chief Complaint  Patient presents with  . Medication Refill    3 mos f/u, also wants info on the physical therapy near her home    HPI:   Patient is a 46 y.o. female  who presents today for routine follow-up.  PCP: Benny LennertSarah Weber, who is out on medical leave  1. Chronic pain, coccyx, secondary to recurring deep pilonidal cysts requiring surgery, scar tissue and possible injury to nerves. Reports constant deep ache/pressure pain with now more recurring sharp shooting pain of affected area which seems to be triggered by current change in the weather. Patient job requires long-term sitting. She engages in stretching, gets up every hour, does hot soaks with dr Reynold Bowenteals salts. Has not been called by PT, referral done in Sept 2018. Requesting name of agency so that she can pursue. Stable on current meds, denies side effects.   2. Obesity/pre-diabetes/hyperlipidemia. Patient has been doing keto diet for about a year now, high fat low card diet. Has had continued steady weight loss.   3. PCOS, on spironolactone, metformin and mirena. Coming close to 5 year mark, starting to have spotting. Denies any hypoglycemia as long as she takes metformin with food.   Dep[facility] screen [facility] 2/9 05/25/2017 12/28/2016 12/22/2016  Decreased Interest 0 0 0  Down, Depressed, Hopeless 0 0 0  PHQ - 2 Score 0 0 0  Altered sleeping - - -  Tired, decreased energy - - -  Change in appetite - - -  Feeling bad or failure about yourself  - - -  Trouble concentrating - - -  Moving slowly or fidgety/restless - - -  Suicidal thoughts - - -  PHQ-9 Score - - -    Allergies  Allergen Reactions  . Iodine Rash    Prior to Admission medications   Medication Sig Start Date End Date Taking? Authorizing Provider  gabapentin (NEURONTIN) 300 MG capsule 600mg  bid with a 900mg  at midnight 05/25/17   Weber, Dema SeverinSarah L, PA-C  hydrocortisone (ANUSOL-HC) 2.5 %  rectal cream Place 1 application rectally 2 (two) times daily. 05/25/17   Weber, Dema SeverinSarah L, PA-C  levonorgestrel (MIRENA) 20 MCG/24HR IUD 1 each by Intrauterine route once.    [provider]  metFORMIN (GLUCOPHAGE) 500 MG tablet Take 1 tablet (500 mg total) by mouth 2 (two) times daily with a meal. 03/02/17   Weber, Dema SeverinSarah L, PA-C  oxyCODONE-acetaminophen (ROXICET) 5-325 MG tablet Take 1 tablet by mouth every 8 (eight) hours as needed. 05/25/17   Weber, Dema SeverinSarah L, PA-C  spironolactone (ALDACTONE) 25 MG tablet Take 2 tablets (50 mg total) by mouth daily. 02/12/17   Valarie ConesWeber, Dema SeverinSarah L, PA-C    Past Medical History:  Diagnosis Date  . Allergy   . Diabetes mellitus without complication (HCC)   . Neuromuscular disorder [facility](HCC)     Past Surgical History:  Procedure Laterality Date  . CHOLECYSTECTOMY    . PILONIDAL CYST / SINUS EXCISION      Social History   Tobacco Use  . Smoking status: Former Smoker    Last attempt to quit: 03/23/2010    Years since quitting: 7.4  . Smokeless tobacco: Never Used  . Tobacco comment: patient uses vape  Substance Use Topics  . Alcohol use: Yes    Alcohol/week: 1.2 oz    Types: 2 Glasses of wine per week    Family History  Problem Relation Age of Onset  . Hypertension  Mother   . Heart disease Mother   . Heart disease Maternal Grandfather   . Diabetes Neg Hx     Review of Systems  Constitutional: Negative for chills and fever.  Respiratory: Negative for cough and shortness of breath.   Cardiovascular: Negative for chest pain, palpitations and leg swelling.  Gastrointestinal: Negative for abdominal pain, nausea and vomiting.     OBJECTIVE:  Blood pressure 122/84, pulse 75, height 5' 4.17" (1.63 m), weight 211 lb 6.4 oz (95.9 kg), SpO2 99 %.  Wt Readings from Last 3 Encounters:  08/16/17 211 lb 6.4 oz (95.9 kg)  05/25/17 214 lb 3.2 oz (97.2 kg)  03/02/17 222 lb 6.4 oz (100.9 kg)    Physical Exam  Constitutional: She is oriented to person,  place, and time and well-developed, well-nourished, and in no distress.  HENT:  Head: Normocephalic and atraumatic.  Mouth/Throat: Mucous membranes are normal.  Eyes: EOM are normal. Pupils are equal, round, and reactive to light. No scleral icterus.  Neck: Neck supple.  Pulmonary/Chest: Effort normal.  Neurological: She is alert and oriented to person, place, and time. Gait normal.  Skin: Skin is warm and dry.  Psychiatric: Mood and affect normal.  Nursing note and vitals reviewed.   ASSESSMENT and PLAN  1. Coccyxdynia Patient is stable on current regime. Patient will call [facility] to arrange PT. UDS collected today, [place] CRS reviewed today, appropriate.  - ToxASSURE Select 13 (MW), Urine - oxyCODONE-acetaminophen (ROXICET) 5-325 MG tablet; Take 1 tablet by mouth every 8 (eight) hours as needed. - oxyCODONE-acetaminophen (ROXICET) 5-325 MG tablet; Take 1 tablet by mouth every 8 (eight) hours as needed for severe pain. - oxyCODONE-acetaminophen (ROXICET) 5-325 MG tablet; Take 1 tablet by mouth every 8 (eight) hours as needed for severe pain.  2. Chronic pain syndrome - ToxASSURE Select 13 (MW), Urine  3. Obesity, Class II, BMI 35-39.9 Continued weight loss on ketogenic diet.   4. Elevated cholesterol Last LDL 170, 6 months ago, rechecking today. Discussed healthier fats as part of ketogenic diet. - Lipid panel  5. Pre-diabetes Last A1c 5.6 in 12/2016. Rechecking today. Stable on metformin and current diet. - Hemoglobin A1c - POCT urinalysis dipstick  6. IUD (intrauterine device) in place Ad[facility].  Return in about 3 months (around 11/14/2017).    Myles LippsIrma M Santiago, MD Primary Care at [facility] [address] [place], [place] [address] Ph.  [phone] Fax [phone]

## 2017-08-19 LAB — TOXASSURE SELECT 13 (MW), URINE

## 2017-08-24 ENCOUNTER — Ambulatory Visit: Payer: 59 | Admitting: Physician Assistant

## 2017-10-06 ENCOUNTER — Encounter: Payer: Self-pay | Admitting: Physician Assistant

## 2017-10-16 ENCOUNTER — Telehealth: Payer: Self-pay | Admitting: Physician Assistant

## 2017-10-19 ENCOUNTER — Encounter: Payer: Self-pay | Admitting: Physician Assistant

## 2017-11-19 ENCOUNTER — Encounter: Payer: Self-pay | Admitting: Physician Assistant

## 2017-11-19 ENCOUNTER — Other Ambulatory Visit: Payer: Self-pay

## 2017-11-19 ENCOUNTER — Ambulatory Visit: Payer: 59 | Admitting: Physician Assistant

## 2017-11-19 VITALS — BP 110/64 | HR 89 | Temp 97.8°F | Resp 18 | Ht 64.17 in | Wt 207.8 lb

## 2017-11-19 DIAGNOSIS — E282 Polycystic ovarian syndrome: Secondary | ICD-10-CM

## 2017-11-19 DIAGNOSIS — G894 Chronic pain syndrome: Secondary | ICD-10-CM

## 2017-11-19 DIAGNOSIS — E7800 Pure hypercholesterolemia, unspecified: Secondary | ICD-10-CM

## 2017-11-19 DIAGNOSIS — M533 Sacrococcygeal disorders, not elsewhere classified: Secondary | ICD-10-CM

## 2017-11-19 MED ORDER — OXYCODONE-ACETAMINOPHEN 5-325 MG PO TABS
1.0000 | ORAL_TABLET | Freq: Three times a day (TID) | ORAL | 0 refills | Status: DC | PRN
Start: 2018-01-19 — End: 2018-02-15

## 2017-11-19 MED ORDER — GABAPENTIN 300 MG PO CAPS: ORAL_CAPSULE | ORAL | 3 refills | Status: AC

## 2017-11-19 MED ORDER — SPIRONOLACTONE 25 MG PO TABS: 50.0000 mg | ORAL_TABLET | Freq: Every day | ORAL | 3 refills | Status: AC

## 2017-11-19 MED ORDER — OXYCODONE-ACETAMINOPHEN 5-325 MG PO TABS: 1.0000 | ORAL_TABLET | Freq: Three times a day (TID) | ORAL | 0 refills | Status: DC | PRN

## 2017-11-19 MED ORDER — METFORMIN HCL 500 MG PO TABS: 500.0000 mg | ORAL_TABLET | Freq: Two times a day (BID) | ORAL | 3 refills | Status: AC

## 2017-11-19 NOTE — Progress Notes (Signed)
Jeanne Choi  MRN: [id number] DOB: 1970/09/29  PCP: Jeanne Riddle, PA-C  Chief Complaint  Patient presents with  . Medication Refill    oxycodone    Subjective:  Pt presents to clinic for medication refill.  She has been doing good.  Still doing keto eating plan.  She is happy with her overall weight loss but she was hoping that she would be under 200 - she has found that her weight loss has slowed some recently.  Needs her Mirena replaced - she has an appt with her GYn to have this done.  She is having some emotional lability and she thinks it is because of her end of life Mirena.  She can recognize her mood instability and is able to stop her outbursts some.  History is obtained by patient.  Review of Systems  Patient Active Problem List   Diagnosis Date Noted  . Pre-diabetes 08/16/2017  . Elevated cholesterol 05/27/2017  . Vitamin D deficiency 12/22/2016  . Chronic pain syndrome 04/28/2016  . Adjustment reaction with anxiety and depression 10/31/2015  . Obesity, Class II, BMI 35-39.9 04/01/2013  . 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 PCOS (polycystic ovarian syndrome) 07/13/2012    Current Outpatient Medications on File Prior to Visit  Medication Sig Dispense Refill  . hydrocortisone (ANUSOL-HC) 2.5 % rectal cream Place 1 application rectally 2 (two) times daily. 30 g 5  . levonorgestrel (MIRENA) 20 MCG/24HR IUD 1 each by Intrauterine route once.     No current facility-administered medications on file prior to visit.     Allergies  Allergen Reactions  . Iodine Rash    Past Medical History:  Diagnosis Date  . Allergy   . Diabetes mellitus without complication (HCC)   . Neuromuscular disorder [facility])    Social History   Social History Narrative   [employer]   3rd shift   Married - no children   Social History   Tobacco Use  . Smoking status: Former Smoker    Last attempt to quit: 03/23/2010    Years since quitting: 7.6  .  Smokeless tobacco: Never Used  . Tobacco comment: patient uses vape  Substance Use Topics  . Alcohol use: Yes    Alcohol/week: 1.2 oz    Types: 2 Glasses of wine per week  . Drug use: No   family history includes Heart disease in her maternal grandfather and mother; Hypertension in her mother.     Objective:  BP 110/64   Pulse 89   Temp 97.8 F (36.6 C) (Oral)   Resp 18   Ht 5' 4.17" (1.63 m)   Wt 207 lb 12.8 oz (94.3 kg)   SpO2 99%   BMI 35.48 kg/m  Body mass index is 35.48 kg/m.  Physical Exam  Constitutional: She is oriented to person, place, and time and well-developed, well-nourished, and in no distress.  HENT:  Head: Normocephalic and atraumatic.  Right Ear: Hearing, tympanic membrane, external ear and ear canal normal.  Left Ear: Hearing, tympanic membrane, external ear and ear canal normal.  Nose: Nose normal.  Mouth/Throat: Uvula is midline, oropharynx is clear and moist and mucous membranes are normal.  Eyes: Conjunctivae and EOM are normal. Pupils are equal, round, and reactive to light.  Neck: Trachea normal and normal range of motion. Neck supple. No thyroid mass and no thyromegaly present.  Cardiovascular: Normal rate, regular rhythm and normal heart sounds.  No murmur heard. Pulmonary/Chest: Effort normal and breath sounds normal.  She has no wheezes.  Abdominal: Soft. Bowel sounds are normal. There is no tenderness.  Musculoskeletal: Normal range of motion.  Lymphadenopathy:    She has no cervical adenopathy.  Neurological: She is alert and oriented to person, place, and time. She has normal motor skills, normal sensation, normal strength and normal reflexes. Gait normal.  Skin: Skin is warm and dry.  Psychiatric: Mood, memory, affect and judgment normal.  Vitals reviewed.   Assessment and Plan :  Elevated cholesterol - pt plans to continue keto eating plan.  She understands that her cholesterol continues to rise but her ratio has stayed the same - she  has family history of heart disease - she does not want medication at this time - she plans to research how long the cholesterol stays elevated with keto change - we will recheck her cholesterol at her 3 month f/u  Coccyxdynia - Plan: oxyCODONE-acetaminophen (ROXICET) 5-325 MG tablet, oxyCODONE-acetaminophen (ROXICET) 5-325 MG tablet, oxyCODONE-acetaminophen (ROXICET) 5-325 MG tablet, gabapentin (NEURONTIN) 300 MG capsule  PCOS (polycystic ovarian syndrome) - Plan: spironolactone (ALDACTONE) 25 MG tablet, metFORMIN (GLUCOPHAGE) 500 MG tablet  Chronic pain syndrome   Medication refilled.  Recheck in 3 months --  Benny LennertSarah Jolette Lana PA-C  Primary Care at [facility] 11/19/2017 11:24 AM

## 2017-11-19 NOTE — Patient Instructions (Signed)
     IF you received an x-ray today, you will receive an invoice from [facility]. Please contact [facility] at [phone] with questions or concerns regarding your invoice.   IF you received labwork today, you will receive an invoice from [facility]. Please contact [facility] at [phone] with questions or concerns regarding your invoice.   Our billing staff will not be able to assist you with questions regarding bills from these companies.  You will be contacted with the lab results as soon as they are available. The fastest way to get your results is to activate your My Chart account. Instructions are located on the last page of this paperwork. If you have not heard from us regarding the results in 2 weeks, please contact this office.     

## 2017-12-09 ENCOUNTER — Telehealth: Payer: Self-pay | Admitting: Physician Assistant

## 2017-12-09 NOTE — Telephone Encounter (Signed)
Copied from CRM #[id number]. Topic: Quick Communication - See Telephone Encounter >> Dec 09, 2017  8:16 AM Guinevere FerrariMorris, Winnell Bento E, NT wrote: CRM for notification. See Telephone encounter for: 12/09/17. [phone] called and said oxyCODONE-acetaminophen (ROXICET) 5-325 MG tablet was sent to the wrong pharmacy. Pls [phone] [place] - [address] [address] [phone] (Phone) [phone] (Fax)

## 2017-12-09 NOTE — Telephone Encounter (Signed)
[facility] - Pt has not picked up rx. - they cannot transfer Pt [facility] [place]

## 2017-12-09 NOTE — Telephone Encounter (Addendum)
Will route to office for final disposition. 

## 2017-12-10 NOTE — Telephone Encounter (Signed)
I sent to her regular pharmacy - is she changing pharmacies?

## 2017-12-10 NOTE — Telephone Encounter (Signed)
Spoke to patient- [facility] [address]. She will get her Rx at the other pharmacy this time- but for further Rx - please send to the [place] location. ( Rx sent [facility])

## 2018-02-15 ENCOUNTER — Other Ambulatory Visit: Payer: Self-pay

## 2018-02-15 ENCOUNTER — Encounter: Payer: Self-pay | Admitting: Physician Assistant

## 2018-02-15 ENCOUNTER — Ambulatory Visit: Payer: 59 | Admitting: Physician Assistant

## 2018-02-15 VITALS — BP 120/68 | HR 83 | Temp 98.6°F | Resp 18 | Ht 64.17 in | Wt 202.0 lb

## 2018-02-15 DIAGNOSIS — E7800 Pure hypercholesterolemia, unspecified: Secondary | ICD-10-CM | POA: Diagnosis not present

## 2018-02-15 DIAGNOSIS — M533 Sacrococcygeal disorders, not elsewhere classified: Secondary | ICD-10-CM

## 2018-02-15 LAB — LIPID PANEL
CHOLESTEROL TOTAL: 272 mg/dL — AB (ref 100–199)
Chol/HDL Ratio: 3.9 ratio (ref 0.0–4.4)
HDL: 70 mg/dL (ref >39)
LDL CALC: 180 mg/dL — AB (ref 0–99)
Triglycerides: 109 mg/dL (ref 0–149)
VLDL Cholesterol Cal: 22 mg/dL (ref 5–40)

## 2018-02-15 MED ORDER — OXYCODONE-ACETAMINOPHEN 5-325 MG PO TABS: 1.0000 | ORAL_TABLET | Freq: Three times a day (TID) | ORAL | 0 refills | Status: DC | PRN

## 2018-02-15 NOTE — Progress Notes (Signed)
Jeanne Choi  MRN: [id number] DOB: 03/19/1971  PCP: Jeanne Riddle, PA-C  Chief Complaint  Patient presents with  . Medication Refill    oxycodone     Subjective:  Pt presents to clinic for medication refill, her pain is controlled on current medication regimen.  She has been doing well since her last visit.  She has continued to try to change the types of cholesterol containing food she is eating, while continuing her keto diet.  She is almost down below 200 pounds and she is very excited about that.  She plans to continue keto diet as she feels so much better eating this way.  History is obtained by patient.  Review of Systems  Cardiovascular: Negative for chest pain.    Patient Active Problem List   Diagnosis Date Noted  . Pre-diabetes 08/16/2017  . Elevated cholesterol 05/27/2017  . Vitamin D deficiency 12/22/2016  . Chronic pain syndrome 04/28/2016  . Adjustment reaction with anxiety and depression 10/31/2015  . Obesity, Class II, BMI 35-39.9 04/01/2013  . 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 PCOS (polycystic ovarian syndrome) 07/13/2012    Current Outpatient Medications on File Prior to Visit  Medication Sig Dispense Refill  . gabapentin (NEURONTIN) 300 MG capsule 600mg  bid with a 900mg  at midnight 630 capsule 3  . hydrocortisone (ANUSOL-HC) 2.5 % rectal cream Place 1 application rectally 2 (two) times daily. 30 g 5  . levonorgestrel (MIRENA) 20 MCG/24HR IUD 1 each by Intrauterine route once.    . metFORMIN (GLUCOPHAGE) 500 MG tablet Take 1 tablet (500 mg total) by mouth 2 (two) times daily with a meal. 180 tablet 3  . spironolactone (ALDACTONE) 25 MG tablet Take 2 tablets (50 mg total) by mouth daily. 180 tablet 3   No current facility-administered medications on file prior to visit.     Allergies  Allergen Reactions  . Iodine Rash    Past Medical History:  Diagnosis Date  . Allergy   . Diabetes mellitus without complication (HCC)   . Neuromuscular disorder  [facility])    Social History   Social History Narrative   [employer]   3rd shift   Married - no children   Social History   Tobacco Use  . Smoking status: Former Smoker    Last attempt to quit: 03/23/2010    Years since quitting: 7.9  . Smokeless tobacco: Never Used  . Tobacco comment: patient uses vape  Substance Use Topics  . Alcohol use: Yes    Alcohol/week: 1.2 oz    Types: 2 Glasses of wine per week  . Drug use: No   family history includes Heart disease in her maternal grandfather and mother; Hypertension in her mother.     Objective:  BP 120/68   Pulse 83   Temp 98.6 F (37 C) (Oral)   Resp 18   Ht 5' 4.17" (1.63 m)   Wt 202 lb (91.6 kg)   SpO2 98%   BMI 34.49 kg/m  Body mass index is 34.49 kg/m.  Physical Exam  Constitutional: She is oriented to person, place, and time.  HENT:  Head: Normocephalic and atraumatic.  Right Ear: Hearing and external ear normal.  Left Ear: Hearing and external ear normal.  Eyes: Conjunctivae are normal.  Neck: Normal range of motion.  Cardiovascular: Normal rate, regular rhythm and normal heart sounds.  No murmur heard. Pulmonary/Chest: Effort normal and breath sounds normal. She has no wheezes.  Neurological: She is alert and oriented  to person, place, and time.  Skin: Skin is warm and dry.  Psychiatric: Judgment normal.  Vitals reviewed.  Wt Readings from Last 3 Encounters:  02/15/18 202 lb (91.6 kg)  11/19/17 207 lb 12.8 oz (94.3 kg)  08/16/17 211 lb 6.4 oz (95.9 kg)    Assessment and Plan :  Elevated cholesterol - Plan: Lipid panel - Check labs, LDL has significantly risen since starting ketogenic diet.  Patient is not interested in medications and wants to see if she we will get her cholesterol to normalize out with time.  Coccyxdynia - Plan: oxyCODONE-acetaminophen (ROXICET) 5-325 MG tablet, oxyCODONE-acetaminophen (ROXICET) 5-325 MG tablet, oxyCODONE-acetaminophen (ROXICET) 5-325 MG tablet  -refill medications recheck 3 months  Benny LennertSarah Everton Bertha PA-C  Primary [facility] at [facility] 02/15/2018 9:18 AM

## 2018-02-15 NOTE — Patient Instructions (Signed)
     IF you received an x-ray today, you will receive an invoice from [facility]. Please contact [facility] at [phone] with questions or concerns regarding your invoice.   IF you received labwork today, you will receive an invoice from [facility]. Please contact [facility] at [phone] with questions or concerns regarding your invoice.   Our billing staff will not be able to assist you with questions regarding bills from these companies.  You will be contacted with the lab results as soon as they are available. The fastest way to get your results is to activate your My Chart account. Instructions are located on the last page of this paperwork. If you have not heard from us regarding the results in 2 weeks, please contact this office.     

## 2018-05-02 ENCOUNTER — Encounter: Payer: Self-pay | Admitting: Physician Assistant

## 2018-05-17 ENCOUNTER — Encounter: Payer: Self-pay | Admitting: Physician Assistant

## 2018-05-17 ENCOUNTER — Ambulatory Visit: Payer: 59 | Admitting: Physician Assistant

## 2018-05-17 ENCOUNTER — Other Ambulatory Visit: Payer: Self-pay

## 2018-05-17 DIAGNOSIS — E282 Polycystic ovarian syndrome: Secondary | ICD-10-CM

## 2018-05-17 DIAGNOSIS — M533 Sacrococcygeal disorders, not elsewhere classified: Secondary | ICD-10-CM

## 2018-05-17 MED ORDER — OXYCODONE-ACETAMINOPHEN 5-325 MG PO TABS: 1.0000 | ORAL_TABLET | Freq: Three times a day (TID) | ORAL | 0 refills | Status: AC | PRN

## 2018-05-17 MED ORDER — TRIAMCINOLONE ACETONIDE 0.5 % EX CREA: 1.0000 "application " | TOPICAL_CREAM | Freq: Two times a day (BID) | CUTANEOUS | 1 refills | Status: AC

## 2018-05-17 NOTE — Progress Notes (Signed)
Jeanne Choi  MRN: [id number] DOB: 09-20-1970  PCP: Morrell Riddle, PA-C  Chief Complaint  Patient presents with  . Medication Refill    oxycodone and spironolactone     Subjective:  Pt presents to clinic for medication refills.  She has been doing well with her keto and she has lost more than 100 lbs.  She has plateaued.  Her pain is not better - in fact that she thinks that it might be worse.  She does know that stress makes her pain worse.  She is not able to walk as much at work because of new rules.   History is obtained by patient.  Review of Systems  Patient Active Problem List   Diagnosis Date Noted  . Pre-diabetes 08/16/2017  . Elevated cholesterol 05/27/2017  . Vitamin D deficiency 12/22/2016  . Chronic pain syndrome 04/28/2016  . Adjustment reaction with anxiety and depression 10/31/2015  . Obesity, Class II, BMI 35-39.9 04/01/2013  . 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 PCOS (polycystic ovarian syndrome) 07/13/2012    Current Outpatient Medications on File Prior to Visit  Medication Sig Dispense Refill  . gabapentin (NEURONTIN) 300 MG capsule 600mg  bid with a 900mg  at midnight 630 capsule 3  . hydrocortisone (ANUSOL-HC) 2.5 % rectal cream Place 1 application rectally 2 (two) times daily. 30 g 5  . levonorgestrel (MIRENA) 20 MCG/24HR IUD 1 each by Intrauterine route once.    . metFORMIN (GLUCOPHAGE) 500 MG tablet Take 1 tablet (500 mg total) by mouth 2 (two) times daily with a meal. 180 tablet 3  . spironolactone (ALDACTONE) 25 MG tablet Take 2 tablets (50 mg total) by mouth daily. 180 tablet 3   No current facility-administered medications on file prior to visit.     Allergies  Allergen Reactions  . Iodine Rash    Past Medical History:  Diagnosis Date  . Allergy   . Diabetes mellitus without complication (HCC)   . Neuromuscular disorder [facility])    Social History   Social History Narrative   [employer]   3rd shift   Married -  no children   Social History   Tobacco Use  . Smoking status: Former Smoker    Last attempt to quit: 03/23/2010    Years since quitting: 8.1  . Smokeless tobacco: Never Used  . Tobacco comment: patient uses vape  Substance Use Topics  . Alcohol use: Yes    Alcohol/week: 2.0 standard drinks    Types: 2 Glasses of wine per week  . Drug use: No   family history includes Heart disease in her maternal grandfather and mother; Hypertension in her mother.     Objective:  BP 134/72   Pulse 99   Temp 98.4 F (36.9 C) (Oral)   Resp 18   Ht 5' 4.17" (1.63 m)   Wt 197 lb (89.4 kg)   SpO2 97%   BMI 33.64 kg/m  Body mass index is 33.64 kg/m.  Wt Readings from Last 3 Encounters:  05/17/18 197 lb (89.4 kg)  02/15/18 202 lb (91.6 kg)  11/19/17 207 lb 12.8 oz (94.3 kg)    Physical Exam  Constitutional: She is oriented to person, place, and time. She appears well-developed and well-nourished.  HENT:  Head: Normocephalic and atraumatic.  Right Ear: Hearing and external ear normal.  Left Ear: Hearing and external ear normal.  Eyes: Conjunctivae are normal.  Neck: Normal range of motion.  Pulmonary/Chest: Effort normal.  Neurological: She is alert and  oriented to person, place, and time.  Skin: Skin is warm, dry and intact.  Psychiatric: She has a normal mood and affect. Her behavior is normal. Judgment and thought content normal.  Vitals reviewed.   Assessment and Plan :  Coccyxdynia - Plan: oxyCODONE-acetaminophen (ROXICET) 5-325 MG tablet, oxyCODONE-acetaminophen (ROXICET) 5-325 MG tablet, oxyCODONE-acetaminophen (ROXICET) 5-325 MG tablet, triamcinolone cream (KENALOG) 0.5 %  PCOS (polycystic ovarian syndrome)   Refills meds for patient - she is doing good.  Patient verbalized to me that they understand the following: diagnosis, what is being done for them, what to expect and what should be done at home.  Their questions have been answered.  See after visit summary for  patient specific instructions.  Benny Lennert PA-C  [facility] at [facility] 05/17/2018 8:56 AM  Please note: Portions of this report may have been transcribed using dragon voice recognition software. Every effort was made to ensure accuracy; however, inadvertent computerized transcription errors may be present.

## 2018-05-17 NOTE — Patient Instructions (Signed)
° ° ° °  If you have lab work done today you will be contacted with your lab results within the next 2 weeks.  If you have not heard from us then please contact us. The fastest way to get your results is to register for My Chart. ° ° °IF you received an x-ray today, you will receive an invoice from [facility]. Please contact [facility] at [phone] with questions or concerns regarding your invoice.  ° °IF you received labwork today, you will receive an invoice from [facility]. Please contact [facility] at [phone] with questions or concerns regarding your invoice.  ° °Our billing staff will not be able to assist you with questions regarding bills from these companies. ° °You will be contacted with the lab results as soon as they are available. The fastest way to get your results is to activate your My Chart account. Instructions are located on the last page of this paperwork. If you have not heard from us regarding the results in 2 weeks, please contact this office. °  ° ° ° °

## 2018-06-03 NOTE — Telephone Encounter (Signed)
Error

## 2020-04-23 LAB — EXTERNAL GENERIC LAB PROCEDURE: COLOGUARD: POSITIVE — AB

## 2022-12-20 ENCOUNTER — Other Ambulatory Visit (HOSPITAL_BASED_OUTPATIENT_CLINIC_OR_DEPARTMENT_OTHER): Payer: Self-pay

## 2022-12-20 ENCOUNTER — Encounter (HOSPITAL_BASED_OUTPATIENT_CLINIC_OR_DEPARTMENT_OTHER): Payer: Self-pay | Admitting: Pharmacist

## 2022-12-20 MED ORDER — MOUNJARO 12.5 MG/0.5ML ~~LOC~~ SOAJ
12.5000 mg | SUBCUTANEOUS | 2 refills | Status: AC
  Filled 2022-12-20: qty 2, 28d supply, fill #0

## 2022-12-22 ENCOUNTER — Other Ambulatory Visit: Payer: Self-pay

## 2022-12-24 ENCOUNTER — Other Ambulatory Visit (HOSPITAL_BASED_OUTPATIENT_CLINIC_OR_DEPARTMENT_OTHER): Payer: Self-pay

## 2023-01-27 ENCOUNTER — Other Ambulatory Visit (HOSPITAL_BASED_OUTPATIENT_CLINIC_OR_DEPARTMENT_OTHER): Payer: Self-pay

## 2023-01-27 MED ORDER — MOUNJARO 15 MG/0.5ML ~~LOC~~ SOAJ
15.0000 mg | SUBCUTANEOUS | 2 refills | Status: AC
  Filled 2023-01-27: qty 2, 28d supply, fill #0

## 2023-01-28 ENCOUNTER — Other Ambulatory Visit (HOSPITAL_BASED_OUTPATIENT_CLINIC_OR_DEPARTMENT_OTHER): Payer: Self-pay

## 2023-01-29 ENCOUNTER — Other Ambulatory Visit: Payer: Self-pay

## 2023-01-29 ENCOUNTER — Other Ambulatory Visit (HOSPITAL_BASED_OUTPATIENT_CLINIC_OR_DEPARTMENT_OTHER): Payer: Self-pay

## 2023-03-20 LAB — EXTERNAL GENERIC LAB PROCEDURE: COLOGUARD: NEGATIVE

## 2023-03-23 ENCOUNTER — Other Ambulatory Visit (HOSPITAL_BASED_OUTPATIENT_CLINIC_OR_DEPARTMENT_OTHER): Payer: Self-pay

## 2023-03-23 MED ORDER — MOUNJARO 15 MG/0.5ML ~~LOC~~ SOAJ
15.0000 mg | SUBCUTANEOUS | 2 refills | Status: AC
  Filled 2023-03-23: qty 2, 28d supply, fill #0
  Filled 2023-07-05: qty 2, 28d supply, fill #1

## 2023-03-24 ENCOUNTER — Other Ambulatory Visit (HOSPITAL_BASED_OUTPATIENT_CLINIC_OR_DEPARTMENT_OTHER): Payer: Self-pay

## 2023-04-07 ENCOUNTER — Other Ambulatory Visit (HOSPITAL_BASED_OUTPATIENT_CLINIC_OR_DEPARTMENT_OTHER): Payer: Self-pay

## 2023-04-12 ENCOUNTER — Other Ambulatory Visit (HOSPITAL_BASED_OUTPATIENT_CLINIC_OR_DEPARTMENT_OTHER): Payer: Self-pay

## 2023-04-12 MED ORDER — MOUNJARO 15 MG/0.5ML ~~LOC~~ SOAJ
15.0000 mg | SUBCUTANEOUS | 2 refills | Status: AC
  Filled 2023-04-14: qty 2, 28d supply, fill #0
  Filled 2023-05-12: qty 2, 28d supply, fill #1
  Filled 2023-05-12: qty 2, 28d supply, fill #0
  Filled 2023-06-08: qty 2, 28d supply, fill #1

## 2023-04-12 MED ORDER — OXYCODONE-ACETAMINOPHEN 7.5-325 MG PO TABS: ORAL_TABLET | ORAL | 0 refills | Status: DC

## 2023-04-12 MED ORDER — OXYCODONE-ACETAMINOPHEN 7.5-325 MG PO TABS
0.5000 | ORAL_TABLET | ORAL | 0 refills | Status: DC
  Filled 2023-04-12: qty 100, 34d supply, fill #0

## 2023-04-14 ENCOUNTER — Other Ambulatory Visit (HOSPITAL_BASED_OUTPATIENT_CLINIC_OR_DEPARTMENT_OTHER): Payer: Self-pay

## 2023-04-17 ENCOUNTER — Other Ambulatory Visit (HOSPITAL_BASED_OUTPATIENT_CLINIC_OR_DEPARTMENT_OTHER): Payer: Self-pay

## 2023-05-12 ENCOUNTER — Other Ambulatory Visit (HOSPITAL_BASED_OUTPATIENT_CLINIC_OR_DEPARTMENT_OTHER): Payer: Self-pay

## 2023-05-12 ENCOUNTER — Other Ambulatory Visit (HOSPITAL_COMMUNITY): Payer: Self-pay

## 2023-05-13 ENCOUNTER — Other Ambulatory Visit (HOSPITAL_COMMUNITY): Payer: Self-pay

## 2023-06-08 ENCOUNTER — Other Ambulatory Visit: Payer: Self-pay

## 2023-06-08 ENCOUNTER — Other Ambulatory Visit (HOSPITAL_BASED_OUTPATIENT_CLINIC_OR_DEPARTMENT_OTHER): Payer: Self-pay

## 2023-07-05 ENCOUNTER — Other Ambulatory Visit (HOSPITAL_BASED_OUTPATIENT_CLINIC_OR_DEPARTMENT_OTHER): Payer: Self-pay

## 2023-07-05 MED ORDER — MOUNJARO 15 MG/0.5ML ~~LOC~~ SOAJ
15.0000 mg | SUBCUTANEOUS | 2 refills | Status: AC
  Filled 2023-07-05: qty 2, 28d supply, fill #0

## 2023-07-16 ENCOUNTER — Other Ambulatory Visit (HOSPITAL_BASED_OUTPATIENT_CLINIC_OR_DEPARTMENT_OTHER): Payer: Self-pay

## 2023-07-16 MED ORDER — MOUNJARO 15 MG/0.5ML ~~LOC~~ SOAJ
15.0000 mg | SUBCUTANEOUS | 2 refills | Status: AC
  Filled 2023-07-16 – 2023-08-02 (×3): qty 2, 28d supply, fill #0
  Filled 2023-09-01: qty 2, 28d supply, fill #1
  Filled 2023-09-30: qty 2, 28d supply, fill #2

## 2023-07-17 ENCOUNTER — Other Ambulatory Visit (HOSPITAL_BASED_OUTPATIENT_CLINIC_OR_DEPARTMENT_OTHER): Payer: Self-pay

## 2023-07-26 ENCOUNTER — Other Ambulatory Visit: Payer: Self-pay

## 2023-08-02 ENCOUNTER — Other Ambulatory Visit (HOSPITAL_BASED_OUTPATIENT_CLINIC_OR_DEPARTMENT_OTHER): Payer: Self-pay

## 2023-08-03 ENCOUNTER — Other Ambulatory Visit (HOSPITAL_BASED_OUTPATIENT_CLINIC_OR_DEPARTMENT_OTHER): Payer: Self-pay

## 2023-08-05 ENCOUNTER — Other Ambulatory Visit (HOSPITAL_BASED_OUTPATIENT_CLINIC_OR_DEPARTMENT_OTHER): Payer: Self-pay

## 2023-09-01 ENCOUNTER — Other Ambulatory Visit (HOSPITAL_BASED_OUTPATIENT_CLINIC_OR_DEPARTMENT_OTHER): Payer: Self-pay

## 2023-09-02 ENCOUNTER — Other Ambulatory Visit (HOSPITAL_BASED_OUTPATIENT_CLINIC_OR_DEPARTMENT_OTHER): Payer: Self-pay

## 2023-09-30 ENCOUNTER — Other Ambulatory Visit (HOSPITAL_BASED_OUTPATIENT_CLINIC_OR_DEPARTMENT_OTHER): Payer: Self-pay

## 2023-10-18 ENCOUNTER — Other Ambulatory Visit (HOSPITAL_BASED_OUTPATIENT_CLINIC_OR_DEPARTMENT_OTHER): Payer: Self-pay

## 2023-10-18 MED ORDER — MOUNJARO 15 MG/0.5ML ~~LOC~~ SOAJ
15.0000 mg | SUBCUTANEOUS | 3 refills | Status: AC
  Filled 2023-10-18 – 2023-10-21 (×2): qty 6, 84d supply, fill #0
  Filled 2024-01-15: qty 6, 84d supply, fill #1
  Filled 2024-04-04 (×2): qty 6, 84d supply, fill #2
  Filled 2024-06-19: qty 6, 84d supply, fill #3

## 2023-10-21 ENCOUNTER — Other Ambulatory Visit (HOSPITAL_BASED_OUTPATIENT_CLINIC_OR_DEPARTMENT_OTHER): Payer: Self-pay

## 2023-10-23 ENCOUNTER — Other Ambulatory Visit (HOSPITAL_BASED_OUTPATIENT_CLINIC_OR_DEPARTMENT_OTHER): Payer: Self-pay

## 2024-01-15 ENCOUNTER — Other Ambulatory Visit (HOSPITAL_BASED_OUTPATIENT_CLINIC_OR_DEPARTMENT_OTHER): Payer: Self-pay

## 2024-01-17 ENCOUNTER — Other Ambulatory Visit (HOSPITAL_COMMUNITY): Payer: Self-pay

## 2024-01-17 ENCOUNTER — Other Ambulatory Visit (HOSPITAL_BASED_OUTPATIENT_CLINIC_OR_DEPARTMENT_OTHER): Payer: Self-pay

## 2024-01-17 MED ORDER — OXYCODONE-ACETAMINOPHEN 7.5-325 MG PO TABS: 0.5000 | ORAL_TABLET | ORAL | 0 refills | Status: DC | PRN

## 2024-01-17 MED ORDER — OXYCODONE-ACETAMINOPHEN 7.5-325 MG PO TABS
0.5000 | ORAL_TABLET | ORAL | 0 refills | Status: AC | PRN
  Filled 2024-01-17: qty 100, 30d supply, fill #0
  Filled 2024-01-17: qty 100, 28d supply, fill #0

## 2024-01-17 MED ORDER — MOUNJARO 15 MG/0.5ML ~~LOC~~ SOAJ
15.0000 mg | SUBCUTANEOUS | 3 refills | Status: AC
  Filled 2024-01-17: qty 6, 84d supply, fill #0

## 2024-02-09 ENCOUNTER — Other Ambulatory Visit (HOSPITAL_BASED_OUTPATIENT_CLINIC_OR_DEPARTMENT_OTHER): Payer: Self-pay

## 2024-02-09 MED ORDER — OXYCODONE-ACETAMINOPHEN 7.5-325 MG PO TABS
ORAL_TABLET | ORAL | 0 refills | Status: AC
  Filled 2024-02-14: qty 100, 30d supply, fill #0
  Filled 2024-02-14: qty 100, fill #0

## 2024-02-10 ENCOUNTER — Other Ambulatory Visit (HOSPITAL_BASED_OUTPATIENT_CLINIC_OR_DEPARTMENT_OTHER): Payer: Self-pay

## 2024-02-10 MED ORDER — OXYCODONE-ACETAMINOPHEN 7.5-325 MG PO TABS
ORAL_TABLET | ORAL | 0 refills | Status: AC
  Filled 2024-03-13: qty 100, 30d supply, fill #0

## 2024-02-10 MED ORDER — OXYCODONE-ACETAMINOPHEN 7.5-325 MG PO TABS
0.5000 | ORAL_TABLET | ORAL | 0 refills | Status: AC | PRN
  Filled 2024-04-11: qty 100, 30d supply, fill #0

## 2024-02-11 ENCOUNTER — Other Ambulatory Visit (HOSPITAL_BASED_OUTPATIENT_CLINIC_OR_DEPARTMENT_OTHER): Payer: Self-pay

## 2024-02-14 ENCOUNTER — Other Ambulatory Visit (HOSPITAL_BASED_OUTPATIENT_CLINIC_OR_DEPARTMENT_OTHER): Payer: Self-pay

## 2024-03-13 ENCOUNTER — Other Ambulatory Visit (HOSPITAL_BASED_OUTPATIENT_CLINIC_OR_DEPARTMENT_OTHER): Payer: Self-pay

## 2024-03-14 ENCOUNTER — Other Ambulatory Visit (HOSPITAL_BASED_OUTPATIENT_CLINIC_OR_DEPARTMENT_OTHER): Payer: Self-pay

## 2024-03-18 ENCOUNTER — Other Ambulatory Visit (HOSPITAL_BASED_OUTPATIENT_CLINIC_OR_DEPARTMENT_OTHER): Payer: Self-pay

## 2024-03-18 MED ORDER — VRAYLAR 1.5 MG PO CAPS
ORAL_CAPSULE | ORAL | 1 refills | Status: AC
  Filled 2024-03-18: qty 30, 30d supply, fill #0

## 2024-04-04 ENCOUNTER — Other Ambulatory Visit (HOSPITAL_BASED_OUTPATIENT_CLINIC_OR_DEPARTMENT_OTHER): Payer: Self-pay

## 2024-04-10 ENCOUNTER — Other Ambulatory Visit (HOSPITAL_BASED_OUTPATIENT_CLINIC_OR_DEPARTMENT_OTHER): Payer: Self-pay

## 2024-04-11 ENCOUNTER — Other Ambulatory Visit (HOSPITAL_BASED_OUTPATIENT_CLINIC_OR_DEPARTMENT_OTHER): Payer: Self-pay

## 2024-04-17 ENCOUNTER — Other Ambulatory Visit: Payer: Self-pay

## 2024-04-17 ENCOUNTER — Other Ambulatory Visit (HOSPITAL_BASED_OUTPATIENT_CLINIC_OR_DEPARTMENT_OTHER): Payer: Self-pay

## 2024-04-17 MED ORDER — OXYCODONE-ACETAMINOPHEN 7.5-325 MG PO TABS
ORAL_TABLET | ORAL | 0 refills | Status: DC
Start: 2024-06-17 — End: 2024-05-10

## 2024-04-17 MED ORDER — OXYCODONE-ACETAMINOPHEN 7.5-325 MG PO TABS
ORAL_TABLET | ORAL | 0 refills | Status: AC
  Filled 2024-04-17: qty 100, 25d supply, fill #0
  Filled 2024-05-10: qty 100, 30d supply, fill #0

## 2024-04-17 MED ORDER — HYDROCORTISONE (PERIANAL) 2.5 % EX CREA
1.0000 | TOPICAL_CREAM | Freq: Two times a day (BID) | CUTANEOUS | 1 refills | Status: AC
  Filled 2024-04-17: qty 30, 10d supply, fill #0
  Filled 2024-04-26 (×2): qty 30, 10d supply, fill #1

## 2024-04-17 MED ORDER — VRAYLAR 3 MG PO CAPS
3.0000 mg | ORAL_CAPSULE | Freq: Every day | ORAL | 1 refills | Status: DC
  Filled 2024-04-17: qty 30, 30d supply, fill #0
  Filled 2024-05-16: qty 30, 30d supply, fill #1

## 2024-04-17 MED ORDER — OXYCODONE-ACETAMINOPHEN 7.5-325 MG PO TABS: 0.5000 | ORAL_TABLET | ORAL | 0 refills | Status: DC | PRN

## 2024-04-17 MED ORDER — MOUNJARO 15 MG/0.5ML ~~LOC~~ SOAJ
15.0000 mg | SUBCUTANEOUS | 3 refills | Status: AC
  Filled 2024-04-17 – 2024-09-25 (×2): qty 6, 84d supply, fill #0

## 2024-04-26 ENCOUNTER — Other Ambulatory Visit (HOSPITAL_BASED_OUTPATIENT_CLINIC_OR_DEPARTMENT_OTHER): Payer: Self-pay

## 2024-05-09 ENCOUNTER — Other Ambulatory Visit (HOSPITAL_BASED_OUTPATIENT_CLINIC_OR_DEPARTMENT_OTHER): Payer: Self-pay

## 2024-05-10 ENCOUNTER — Other Ambulatory Visit (HOSPITAL_BASED_OUTPATIENT_CLINIC_OR_DEPARTMENT_OTHER): Payer: Self-pay

## 2024-05-10 ENCOUNTER — Other Ambulatory Visit (HOSPITAL_COMMUNITY): Payer: Self-pay

## 2024-05-16 ENCOUNTER — Other Ambulatory Visit (HOSPITAL_BASED_OUTPATIENT_CLINIC_OR_DEPARTMENT_OTHER): Payer: Self-pay

## 2024-05-16 MED ORDER — OXYCODONE-ACETAMINOPHEN 7.5-325 MG PO TABS: 1.0000 | ORAL_TABLET | Freq: Four times a day (QID) | ORAL | 0 refills | Status: AC | PRN

## 2024-05-17 ENCOUNTER — Other Ambulatory Visit (HOSPITAL_BASED_OUTPATIENT_CLINIC_OR_DEPARTMENT_OTHER): Payer: Self-pay

## 2024-06-05 ENCOUNTER — Other Ambulatory Visit (HOSPITAL_COMMUNITY): Payer: Self-pay

## 2024-06-05 ENCOUNTER — Other Ambulatory Visit (HOSPITAL_BASED_OUTPATIENT_CLINIC_OR_DEPARTMENT_OTHER): Payer: Self-pay

## 2024-06-06 ENCOUNTER — Other Ambulatory Visit (HOSPITAL_BASED_OUTPATIENT_CLINIC_OR_DEPARTMENT_OTHER): Payer: Self-pay

## 2024-06-06 MED ORDER — CARIPRAZINE HCL 3 MG PO CAPS
3.0000 mg | ORAL_CAPSULE | Freq: Every day | ORAL | 1 refills | Status: AC
  Filled 2024-06-06 – 2024-06-17 (×2): qty 30, 30d supply, fill #0

## 2024-06-06 MED ORDER — OXYCODONE-ACETAMINOPHEN 7.5-325 MG PO TABS
1.0000 | ORAL_TABLET | Freq: Four times a day (QID) | ORAL | 0 refills | Status: AC | PRN
  Filled 2024-07-06: qty 120, 30d supply, fill #0

## 2024-06-06 MED ORDER — OXYCODONE-ACETAMINOPHEN 7.5-325 MG PO TABS
ORAL_TABLET | ORAL | 0 refills | Status: AC
  Filled 2024-06-06: qty 120, 30d supply, fill #0

## 2024-06-06 MED ORDER — OXYCODONE-ACETAMINOPHEN 7.5-325 MG PO TABS: ORAL_TABLET | ORAL | 0 refills | Status: AC

## 2024-06-07 ENCOUNTER — Other Ambulatory Visit (HOSPITAL_BASED_OUTPATIENT_CLINIC_OR_DEPARTMENT_OTHER): Payer: Self-pay

## 2024-06-14 ENCOUNTER — Other Ambulatory Visit: Payer: Self-pay | Admitting: Medical Genetics

## 2024-06-17 ENCOUNTER — Other Ambulatory Visit (HOSPITAL_BASED_OUTPATIENT_CLINIC_OR_DEPARTMENT_OTHER): Payer: Self-pay

## 2024-06-17 ENCOUNTER — Other Ambulatory Visit: Payer: Self-pay

## 2024-06-19 ENCOUNTER — Other Ambulatory Visit: Payer: Self-pay

## 2024-06-19 ENCOUNTER — Other Ambulatory Visit (HOSPITAL_BASED_OUTPATIENT_CLINIC_OR_DEPARTMENT_OTHER): Payer: Self-pay

## 2024-06-20 ENCOUNTER — Other Ambulatory Visit (HOSPITAL_BASED_OUTPATIENT_CLINIC_OR_DEPARTMENT_OTHER): Payer: Self-pay

## 2024-07-03 ENCOUNTER — Other Ambulatory Visit (HOSPITAL_BASED_OUTPATIENT_CLINIC_OR_DEPARTMENT_OTHER): Payer: Self-pay

## 2024-07-06 ENCOUNTER — Other Ambulatory Visit (HOSPITAL_BASED_OUTPATIENT_CLINIC_OR_DEPARTMENT_OTHER): Payer: Self-pay

## 2024-07-18 ENCOUNTER — Other Ambulatory Visit (HOSPITAL_BASED_OUTPATIENT_CLINIC_OR_DEPARTMENT_OTHER): Payer: Self-pay

## 2024-07-18 MED ORDER — VRAYLAR 1.5 MG PO CAPS
1.5000 mg | ORAL_CAPSULE | Freq: Every day | ORAL | 0 refills | Status: AC
  Filled 2024-07-18: qty 30, 30d supply, fill #0

## 2024-07-18 MED ORDER — OXYCODONE-ACETAMINOPHEN 7.5-325 MG PO TABS
1.0000 | ORAL_TABLET | Freq: Four times a day (QID) | ORAL | 0 refills | Status: AC | PRN
  Filled 2024-10-09: qty 120, 30d supply, fill #0

## 2024-07-18 MED ORDER — OXYCODONE-ACETAMINOPHEN 7.5-325 MG PO TABS
1.0000 | ORAL_TABLET | Freq: Four times a day (QID) | ORAL | 0 refills | Status: AC | PRN
  Filled 2024-08-08: qty 120, 30d supply, fill #0

## 2024-07-18 MED ORDER — OXYCODONE-ACETAMINOPHEN 7.5-325 MG PO TABS
1.0000 | ORAL_TABLET | Freq: Four times a day (QID) | ORAL | 0 refills | Status: AC | PRN
  Filled 2024-09-09: qty 120, 30d supply, fill #0

## 2024-08-08 ENCOUNTER — Other Ambulatory Visit (HOSPITAL_BASED_OUTPATIENT_CLINIC_OR_DEPARTMENT_OTHER): Payer: Self-pay

## 2024-09-09 ENCOUNTER — Other Ambulatory Visit (HOSPITAL_BASED_OUTPATIENT_CLINIC_OR_DEPARTMENT_OTHER): Payer: Self-pay

## 2024-09-25 ENCOUNTER — Other Ambulatory Visit (HOSPITAL_BASED_OUTPATIENT_CLINIC_OR_DEPARTMENT_OTHER): Payer: Self-pay

## 2024-10-09 ENCOUNTER — Other Ambulatory Visit (HOSPITAL_BASED_OUTPATIENT_CLINIC_OR_DEPARTMENT_OTHER): Payer: Self-pay

## 2024-10-17 ENCOUNTER — Other Ambulatory Visit (HOSPITAL_BASED_OUTPATIENT_CLINIC_OR_DEPARTMENT_OTHER): Payer: Self-pay

## 2024-10-17 MED ORDER — OXYCODONE-ACETAMINOPHEN 7.5-325 MG PO TABS: ORAL_TABLET | ORAL | 0 refills | Status: AC

## 2024-10-17 MED ORDER — OXYCODONE-ACETAMINOPHEN 7.5-325 MG PO TABS: 1.0000 | ORAL_TABLET | Freq: Four times a day (QID) | ORAL | 0 refills | Status: AC | PRN
